# Patient Record
Sex: Male | Born: 1960 | Race: Black or African American | Hispanic: No | Marital: Single | Smoking: Current every day smoker
Health system: Southern US, Community
[De-identification: ages and names within clinical notes are randomized; demographics above are authoritative.]

## PROBLEM LIST (undated history)

## (undated) DIAGNOSIS — K219 Gastro-esophageal reflux disease without esophagitis: Secondary | ICD-10-CM

## (undated) DIAGNOSIS — M1990 Unspecified osteoarthritis, unspecified site: Secondary | ICD-10-CM

---

## 2004-11-04 ENCOUNTER — Encounter: Admission: RE | Admit: 2004-11-04 | Discharge: 2004-11-04 | Payer: Self-pay | Admitting: Occupational Medicine

## 2006-07-15 ENCOUNTER — Encounter: Admission: RE | Admit: 2006-07-15 | Discharge: 2006-07-15 | Payer: Self-pay | Admitting: Orthopaedic Surgery

## 2008-01-09 ENCOUNTER — Inpatient Hospital Stay (HOSPITAL_COMMUNITY): Admission: RE | Admit: 2008-01-09 | Discharge: 2008-01-12 | Payer: Self-pay | Admitting: Orthopaedic Surgery

## 2010-08-25 NOTE — Op Note (Signed)
NAMEKYLLE, LALL              ACCOUNT NO.:  [id number]   MEDICAL RECORD NO.:  [id number]          PATIENT TYPE:  INP   LOCATION:  5016                         FACILITY:  [facility]   PHYSICIAN:  Vanita Panda. 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   PROCEDURE:  Right total hip arthroplasty.   COMPONENTS:  DePuy ASR acetabular component size 52 cup, size 4 Summit  HA-coated femoral component high offset, size 46 femoral head, and -1  taper sleeve adapter.   SURGEON:  Vanita Panda. Magnus Ivan, MD   ASSISTANT:  Wende Neighbors, PA-C   ANESTHESIA:  General.   ANTIBIOTICS:  One gram IV Ancef.   BLOOD LOSS:  250 mL.   COMPLICATIONS:  None.   INDICATIONS:  Briefly, Mr. Koranda is a 50 year old male that I have  been following for several years with severe avascular necrosis  involving both his hips.  He had developed severe pain in his right hip  and it had worsened over a period of time.  I recommended he undergo  total hip replacement when it got to the point where the pain is  affecting his activities of daily living.  It is at this time now that  he is presenting with severe right hip pain and wishes to proceed, with  anything that has an impact on his activities daily living.  He wishes  to proceed with total hip replacement.  The risks and benefits of this  were explained him at length, and he agreed to proceed with surgery.   PROCEDURE DESCRIPTION:  After informed consent WAS obtained, appropriate  right hip was marked.  Mr. Claw was brought to the operating and  placed supine on the operating table.  General anesthesia was obtained.  He was then positioned in the lateral decubitus position with the right  operative hip up.   Hip positioners were then placed in the front and  back and an axillary roll was placed as well and appropriate padding was  placed to the down nonoperative left leg.  His right leg was then  prepped and draped with DuraPrep and sterile drapes including a sterile  stockinette.  A leg bag was placed as well for bring the leg off the  table since I used an anterior lateral approach.  A time-out was called  to identify the correct patient and correct right hip.  I then made an  incision directly over the greater trochanter and carried this  proximally and distally.  I dissected down to the iliotibial band and  divided this longitudinally.  From there, I took an anterolateral  approach to the hip.  I identified the gluteus medius and minimus  tendons and took these as sleeve off the vastus ridge.  I reflected  these anterior.  I then divided the capsule, and there was an effusion  noted in the hip.  There was abundant osteophytes and you could see  there was deformity of the femoral head.  The leg was then brought up  from the table and the hip was dislocated.  I made a femoral neck cut at  approximately a fingerbreadth or just a little more the fingerbreadth  from the tip of the lesser trochanter.  The femoral head was removed in  its entirety and size was collected off this.  I then cleaned the  acetabulum of debris and put Hohmann retractors in place.  I then  sequentially reamed the acetabulum, first with medializing and then  selecting my version, starting from 45-mm reamer up to 51.  As I got to  49, 50, and 51, I selected my version of the component.  I then placed a  trial 52 liner and this did bottom out and had good rim fit.  Next, I  placed the real size 52 acetabular component which was a metal-on-metal  component.  I tapped this into place and was felt to be stable.  Next, I  brought the leg off the table and to expose the femoral canal.  A canal  finding guide was inserted, and  a cookie cutter guide to gain access the  femoral canal.  I then used a lateralizing reamer and then reamed with a  size 0 reamer followed by the 2, 3 and then the size 3 and 4 reamers.  I  then started broaching from a size 1 broach up to size 4 broach.  The  size 4 broach was felt to be the most stable.  I cleaned the calcar off  this and then placed a high offset -1 neck with high offset and a size  46 trial head and reduced this into the acetabulum.  I put the hip  through range of motion and felt to be stable and his leg lengths were  felt to be equal.  I then removed the trial femoral component head and  neck.  I thoroughly irrigated tissues and then placed the real femoral  stem which was a Summit HA-coated stem with high offset.  Once I tapped  this into the femoral canal, I had -1 taper sleeve adapter and this  placed into the size 46 head.  I placed this and then reduced the hip  back to the acetabulum, put it through range of motion and was felt to  be stable.  Again, I felt his leg lengths were near equal.  I then  irrigated the tissues again and closed the joint capsule with  interrupted #1 Ethibond suture.  I reapproximated the gluteus medius and  minimus back to the vastus ridge a #1 Ethibond suture and oversewed this  with Ethibond suture as well.  I then closed the IT band in an  interrupted format with #1 Vicryl suture followed by 2-0 Vicryl in the  subcutaneous tissue and a running 4-0 Vicryl in subcuticular stitch.  A  well-padded sterile dressing was applied.  The patient was rolled into a  supine position.  His leg wounds were found to be near equal.  He was  awakened, extubated, and taken to recovery room in stable condition.  All final counts were correct, and there were no complications noted.      Vanita Panda. Magnus Ivan, M.D.  Electronically Signed     CYB/MEDQ  D:  01/09/2008  T:  01/10/2008  Job:  [id number]

## 2010-08-28 NOTE — Discharge Summary (Signed)
Bradley Mayo, Bradley Mayo              ACCOUNT NO.:  [id number]   MEDICAL RECORD NO.:  [id number]          PATIENT TYPE:  INP   LOCATION:  5016                         FACILITY:  [facility]   PHYSICIAN:  Vanita Panda. Magnus Ivan, M.D.DATE OF BIRTH:  [birth year]   DATE OF ADMISSION:  01/09/2008  DATE OF DISCHARGE:  01/12/2008                               DISCHARGE SUMMARY   ADMITTING DIAGNOSIS:  Stage IV avascular necrosis, right hip.   DISCHARGE DIAGNOSIS:  Stage IV avascular necrosis, right hip.   PROCEDURES:  Right total hip arthroplasty on January 09, 2008.   HOSPITAL COURSE:  Briefly, [name] is a 50 year old male who has  known bilateral hip avascular necrosis with the right being worse than  the left.  We have tried to follow this for some period of time and  temporize it with pain medications.  He has gotten to the point where  this is severely affecting his activities of daily living.  X-rays  confirm collapse on both the femoral head as well as the acetabular side  of the right hip.  His left hip does appear to be stable.  We  recommended a total hip arthroplasty given this impact on his daily life  and the nature of his disease.  He agreed to proceed with surgery.   On the day of admission, he was taken to the operating room where he  underwent a right total hip replacement without complications.  For  detailed description of the operation, please refer to the dictated  operative note in the patient's medical record.  Postoperatively, he was  admitted to orthopedic floor and had a normal convalescence without any  complications on the orthopedic floor.  By the day of discharge, he was  tolerating oral diet as well as oral pain medications.  It was felt that  he could be discharged home safely with home health followup.   DISPOSITION:  To home.   DISCHARGE MEDICATIONS:  1. Percocet.  2. Robaxin.  3. Coumadin.   DISCHARGE INSTRUCTIONS:  While at home, he can shower and  get his  incision wet.  He will have therapy come into his house to work with  balance and coordination as well as activities of daily living.  A  follow-up appointment will be established in my office in 2 weeks after  discharge.      Vanita Panda. Magnus Ivan, M.D.  Electronically Signed     CYB/MEDQ  D:  02/13/2008  T:  02/14/2008  Job:  [id number]

## 2011-01-11 LAB — CBC
HCT: 38.5 — ABNORMAL LOW
Hemoglobin: 10.8 — ABNORMAL LOW
Hemoglobin: 13
MCHC: 33.8
MCV: 86.6
RBC: 4.45
WBC: 4.6

## 2011-01-11 LAB — BASIC METABOLIC PANEL
GFR calc Af Amer: >60
Potassium: 4
Sodium: 134 — ABNORMAL LOW

## 2011-01-11 LAB — PROTIME-INR
INR: 0.9
INR: 1
Prothrombin Time: 13.1

## 2011-01-12 LAB — CBC
HCT: 27.4 — ABNORMAL LOW
Hemoglobin: 9.4 — ABNORMAL LOW
MCHC: 34.1
Platelets: 163
WBC: 7.8
WBC: 8.7

## 2011-01-12 LAB — BASIC METABOLIC PANEL
BUN: 6
CO2: 28
Calcium: 8.3 — ABNORMAL LOW
Chloride: 99
Creatinine, Ser: 1.01
GFR calc Af Amer: >60
GFR calc non Af Amer: >60
GFR calc non Af Amer: >60
Sodium: 137

## 2011-01-12 LAB — PROTIME-INR: INR: 1.5

## 2011-12-08 ENCOUNTER — Other Ambulatory Visit (HOSPITAL_COMMUNITY): Payer: Self-pay | Admitting: Orthopaedic Surgery

## 2012-01-26 ENCOUNTER — Encounter (HOSPITAL_COMMUNITY): Payer: Self-pay | Admitting: Pharmacy Technician

## 2012-01-28 ENCOUNTER — Encounter (HOSPITAL_COMMUNITY)
Admission: RE | Admit: 2012-01-28 | Discharge: 2012-01-28 | Disposition: A | Discharge status: Home / Self Care | Payer: BC Managed Care – PPO | Source: Ambulatory Visit | Attending: Orthopaedic Surgery | Admitting: Orthopaedic Surgery

## 2012-01-28 ENCOUNTER — Encounter (HOSPITAL_COMMUNITY): Payer: Self-pay

## 2012-01-28 DIAGNOSIS — M1990 Unspecified osteoarthritis, unspecified site: Secondary | ICD-10-CM

## 2012-01-28 DIAGNOSIS — K219 Gastro-esophageal reflux disease without esophagitis: Secondary | ICD-10-CM

## 2012-01-28 HISTORY — DX: Unspecified osteoarthritis, unspecified site: M19.90

## 2012-01-28 HISTORY — DX: Gastro-esophageal reflux disease without esophagitis: K21.9

## 2012-01-28 HISTORY — PX: JOINT REPLACEMENT: SHX530

## 2012-01-28 HISTORY — PX: HERNIA REPAIR: SHX51

## 2012-01-28 LAB — SURGICAL PCR SCREEN
MRSA, PCR: NEGATIVE
Staphylococcus aureus: POSITIVE — AB

## 2012-01-28 LAB — CBC
MCHC: 33 g/dL (ref 30.0–36.0)
Platelets: 265 10*3/uL (ref 150–400)
RDW: 14.9 % (ref 11.5–15.5)
WBC: 4.9 10*3/uL (ref 4.0–10.5)

## 2012-01-28 LAB — URINALYSIS, ROUTINE W REFLEX MICROSCOPIC
Nitrite: NEGATIVE
Protein, ur: NEGATIVE mg/dL
Specific Gravity, Urine: 1.019 (ref 1.005–1.030)
Urobilinogen, UA: 0.2 mg/dL (ref 0.0–1.0)

## 2012-01-28 LAB — ABO/RH: ABO/RH(D): O POS

## 2012-01-28 LAB — BASIC METABOLIC PANEL
BUN: 12 mg/dL (ref 6–23)
Calcium: 9.6 mg/dL (ref 8.4–10.5)
Chloride: 101 mEq/L (ref 96–112)
Creatinine, Ser: 0.96 mg/dL (ref 0.50–1.35)
GFR calc Af Amer: >90 mL/min (ref >90)
GFR calc non Af Amer: >90 mL/min (ref >90)

## 2012-01-28 NOTE — Pre-Procedure Instructions (Signed)
01-28-12 1700 pt. Notified of Positive PCR screen for Staph aureus-will fill Rx. For Mupirocin and uses as directed.W. Kennon Portela

## 2012-01-28 NOTE — Patient Instructions (Addendum)
[address]  01/28/2012   Your procedure is scheduled on:   02-04-2012  Report to [facility] at      0530  AM.  Call this number if you have problems the morning of surgery: [phone]  Or Presurgical Testing [phone](Bradley Mayo)   Remember:   Do not eat food:After Midnight.    Take these medicines the morning of surgery with A SIP OF WATER: Tramadol.   Do not wear jewelry, make-up or nail polish.  Do not wear lotions, powders, or perfumes. You may wear deodorant.  Do not shave 48 hours prior to surgery.(face and neck okay, no shaving of legs)  Do not bring valuables to the hospital.  Contacts, dentures or bridgework may not be worn into surgery.  Leave suitcase in the car. After surgery it may be brought to your room.  For patients admitted to the hospital, checkout time is 11:00 AM the day of discharge.   Patients discharged the day of surgery will not be allowed to drive home. Must have responsible person with you x 24 hours once discharged.  Name and phone number of your driver: Bradley Mayo, mother [phone]  Special Instructions: CHG Shower Use Special Wash: see special instruction sheet.(avoid face and genitals)   Please read over the following fact sheets that you were given: MRSA Information, Blood Transfusion fact sheet, Incentive Spirometry Instruction.

## 2012-02-04 ENCOUNTER — Encounter (HOSPITAL_COMMUNITY): Payer: Self-pay | Admitting: Anesthesiology

## 2012-02-04 ENCOUNTER — Ambulatory Visit (HOSPITAL_COMMUNITY): Payer: BC Managed Care – PPO | Admitting: Anesthesiology

## 2012-02-04 ENCOUNTER — Ambulatory Visit (HOSPITAL_COMMUNITY): Payer: BC Managed Care – PPO

## 2012-02-04 ENCOUNTER — Encounter (HOSPITAL_COMMUNITY): Payer: Self-pay | Admitting: *Deleted

## 2012-02-04 ENCOUNTER — Inpatient Hospital Stay (HOSPITAL_COMMUNITY)
Admission: RE | Admit: 2012-02-04 | Discharge: 2012-02-07 | DRG: 818 | Disposition: A | Discharge status: Home Health | Payer: BC Managed Care – PPO | Source: Ambulatory Visit | Attending: Orthopaedic Surgery | Admitting: Orthopaedic Surgery

## 2012-02-04 ENCOUNTER — Encounter (HOSPITAL_COMMUNITY)
Admission: RE | Disposition: A | Discharge status: Home Health | Payer: Self-pay | Source: Ambulatory Visit | Attending: Orthopaedic Surgery

## 2012-02-04 DIAGNOSIS — K219 Gastro-esophageal reflux disease without esophagitis: Secondary | ICD-10-CM | POA: Diagnosis present

## 2012-02-04 DIAGNOSIS — F17200 Nicotine dependence, unspecified, uncomplicated: Secondary | ICD-10-CM | POA: Diagnosis present

## 2012-02-04 DIAGNOSIS — M87059 Idiopathic aseptic necrosis of unspecified femur: Principal | ICD-10-CM

## 2012-02-04 DIAGNOSIS — Z01812 Encounter for preprocedural laboratory examination: Secondary | ICD-10-CM

## 2012-02-04 DIAGNOSIS — M8970 Major osseous defect, unspecified site: Secondary | ICD-10-CM | POA: Diagnosis present

## 2012-02-04 HISTORY — PX: TOTAL HIP ARTHROPLASTY: SHX124

## 2012-02-04 LAB — TYPE AND SCREEN: Antibody Screen: NEGATIVE

## 2012-02-04 SURGERY — ARTHROPLASTY, HIP, TOTAL, ANTERIOR APPROACH
Anesthesia: General | Site: Hip | Laterality: Left | Wound class: Clean

## 2012-02-04 MED ORDER — HYDROMORPHONE HCL PF 1 MG/ML IJ SOLN
0.5000 mg | INTRAMUSCULAR | Status: DC | PRN
  Administered 2012-02-04 (×2): 0.5 mg via INTRAVENOUS

## 2012-02-04 MED ORDER — ACETAMINOPHEN 10 MG/ML IV SOLN
INTRAVENOUS | Status: AC
  Filled 2012-02-04: qty 100

## 2012-02-04 MED ORDER — MIDAZOLAM HCL 5 MG/5ML IJ SOLN
INTRAMUSCULAR | Status: DC | PRN
  Administered 2012-02-04: 2 mg via INTRAVENOUS

## 2012-02-04 MED ORDER — SODIUM CHLORIDE 0.9 % IV SOLN
INTRAVENOUS | Status: DC
  Administered 2012-02-04 – 2012-02-05 (×2): via INTRAVENOUS

## 2012-02-04 MED ORDER — MENTHOL 3 MG MT LOZG: 1.0000 | LOZENGE | OROMUCOSAL | Status: DC | PRN

## 2012-02-04 MED ORDER — METOCLOPRAMIDE HCL 10 MG PO TABS: 5.0000 mg | ORAL_TABLET | Freq: Three times a day (TID) | ORAL | Status: DC | PRN

## 2012-02-04 MED ORDER — NEOSTIGMINE METHYLSULFATE 1 MG/ML IJ SOLN
INTRAMUSCULAR | Status: DC | PRN
  Administered 2012-02-04: 4 mg via INTRAVENOUS

## 2012-02-04 MED ORDER — GLYCOPYRROLATE 0.2 MG/ML IJ SOLN
INTRAMUSCULAR | Status: DC | PRN
  Administered 2012-02-04: 0.6 mg via INTRAVENOUS

## 2012-02-04 MED ORDER — PROMETHAZINE HCL 25 MG/ML IJ SOLN: 6.2500 mg | INTRAMUSCULAR | Status: DC | PRN

## 2012-02-04 MED ORDER — PROPOFOL 10 MG/ML IV BOLUS
INTRAVENOUS | Status: DC | PRN
  Administered 2012-02-04: 170 mg via INTRAVENOUS
  Administered 2012-02-04: 30 mg via INTRAVENOUS

## 2012-02-04 MED ORDER — KETOROLAC TROMETHAMINE 15 MG/ML IJ SOLN
INTRAMUSCULAR | Status: AC
  Administered 2012-02-04: 15 mg
  Filled 2012-02-04: qty 1

## 2012-02-04 MED ORDER — ALUM & MAG HYDROXIDE-SIMETH 200-200-20 MG/5ML PO SUSP: 30.0000 mL | ORAL | Status: DC | PRN

## 2012-02-04 MED ORDER — ZOLPIDEM TARTRATE 5 MG PO TABS: 5.0000 mg | ORAL_TABLET | Freq: Every evening | ORAL | Status: DC | PRN

## 2012-02-04 MED ORDER — ONDANSETRON HCL 4 MG PO TABS: 4.0000 mg | ORAL_TABLET | Freq: Four times a day (QID) | ORAL | Status: DC | PRN

## 2012-02-04 MED ORDER — HYDROMORPHONE HCL PF 1 MG/ML IJ SOLN
INTRAMUSCULAR | Status: AC
  Filled 2012-02-04: qty 1

## 2012-02-04 MED ORDER — LIDOCAINE HCL 1 % IJ SOLN
INTRAMUSCULAR | Status: DC | PRN
  Administered 2012-02-04: 60 mg via INTRADERMAL

## 2012-02-04 MED ORDER — DOCUSATE SODIUM 100 MG PO CAPS
100.0000 mg | ORAL_CAPSULE | Freq: Two times a day (BID) | ORAL | Status: DC
  Administered 2012-02-04 – 2012-02-07 (×6): 100 mg via ORAL

## 2012-02-04 MED ORDER — ONDANSETRON HCL 4 MG/2ML IJ SOLN: 4.0000 mg | Freq: Four times a day (QID) | INTRAMUSCULAR | Status: DC | PRN

## 2012-02-04 MED ORDER — ACETAMINOPHEN 650 MG RE SUPP: 650.0000 mg | Freq: Four times a day (QID) | RECTAL | Status: DC | PRN

## 2012-02-04 MED ORDER — CEFAZOLIN SODIUM 1-5 GM-% IV SOLN
1.0000 g | Freq: Four times a day (QID) | INTRAVENOUS | Status: AC
  Administered 2012-02-04 (×2): 1 g via INTRAVENOUS
  Filled 2012-02-04 (×2): qty 50

## 2012-02-04 MED ORDER — KETOROLAC TROMETHAMINE 15 MG/ML IJ SOLN
15.0000 mg | Freq: Four times a day (QID) | INTRAMUSCULAR | Status: AC
  Administered 2012-02-04 – 2012-02-05 (×4): 15 mg via INTRAVENOUS
  Filled 2012-02-04 (×3): qty 1

## 2012-02-04 MED ORDER — ACETAMINOPHEN 325 MG PO TABS: 650.0000 mg | ORAL_TABLET | Freq: Four times a day (QID) | ORAL | Status: DC | PRN

## 2012-02-04 MED ORDER — OXYCODONE HCL 5 MG PO TABS
5.0000 mg | ORAL_TABLET | ORAL | Status: DC | PRN
  Administered 2012-02-04 – 2012-02-06 (×10): 10 mg via ORAL
  Administered 2012-02-06: 5 mg via ORAL
  Administered 2012-02-06 – 2012-02-07 (×4): 10 mg via ORAL
  Filled 2012-02-04 (×6): qty 2
  Filled 2012-02-04: qty 1
  Filled 2012-02-04 (×8): qty 2

## 2012-02-04 MED ORDER — HYDROMORPHONE HCL PF 1 MG/ML IJ SOLN
0.2500 mg | INTRAMUSCULAR | Status: DC | PRN
  Administered 2012-02-04 (×4): 0.5 mg via INTRAVENOUS

## 2012-02-04 MED ORDER — CEFAZOLIN SODIUM-DEXTROSE 2-3 GM-% IV SOLR
INTRAVENOUS | Status: AC
  Filled 2012-02-04: qty 50

## 2012-02-04 MED ORDER — PHENOL 1.4 % MT LIQD: 1.0000 | OROMUCOSAL | Status: DC | PRN

## 2012-02-04 MED ORDER — LACTATED RINGERS IV SOLN
INTRAVENOUS | Status: DC | PRN
  Administered 2012-02-04 (×3): via INTRAVENOUS

## 2012-02-04 MED ORDER — RIVAROXABAN 10 MG PO TABS
10.0000 mg | ORAL_TABLET | Freq: Every day | ORAL | Status: DC
  Administered 2012-02-05 – 2012-02-07 (×3): 10 mg via ORAL
  Filled 2012-02-04 (×4): qty 1

## 2012-02-04 MED ORDER — OXYCODONE HCL ER 10 MG PO T12A
10.0000 mg | EXTENDED_RELEASE_TABLET | Freq: Two times a day (BID) | ORAL | Status: DC
  Administered 2012-02-04 – 2012-02-06 (×5): 10 mg via ORAL
  Filled 2012-02-04 (×6): qty 1

## 2012-02-04 MED ORDER — DIPHENHYDRAMINE HCL 12.5 MG/5ML PO ELIX: 12.5000 mg | ORAL_SOLUTION | ORAL | Status: DC | PRN

## 2012-02-04 MED ORDER — METHOCARBAMOL 500 MG PO TABS
500.0000 mg | ORAL_TABLET | Freq: Four times a day (QID) | ORAL | Status: DC | PRN
  Administered 2012-02-04 – 2012-02-07 (×6): 500 mg via ORAL
  Filled 2012-02-04 (×7): qty 1

## 2012-02-04 MED ORDER — METOCLOPRAMIDE HCL 5 MG/ML IJ SOLN: 5.0000 mg | Freq: Three times a day (TID) | INTRAMUSCULAR | Status: DC | PRN

## 2012-02-04 MED ORDER — 0.9 % SODIUM CHLORIDE (POUR BTL) OPTIME
TOPICAL | Status: DC | PRN
  Administered 2012-02-04: 1000 mL

## 2012-02-04 MED ORDER — FENTANYL CITRATE 0.05 MG/ML IJ SOLN
INTRAMUSCULAR | Status: DC | PRN
  Administered 2012-02-04 (×5): 50 ug via INTRAVENOUS

## 2012-02-04 MED ORDER — ONDANSETRON HCL 4 MG/2ML IJ SOLN
INTRAMUSCULAR | Status: DC | PRN
  Administered 2012-02-04: 4 mg via INTRAVENOUS

## 2012-02-04 MED ORDER — CEFAZOLIN SODIUM-DEXTROSE 2-3 GM-% IV SOLR
2.0000 g | INTRAVENOUS | Status: AC
  Administered 2012-02-04: 2 g via INTRAVENOUS

## 2012-02-04 MED ORDER — HYDROMORPHONE HCL PF 1 MG/ML IJ SOLN: 1.0000 mg | INTRAMUSCULAR | Status: DC | PRN

## 2012-02-04 MED ORDER — METHOCARBAMOL 100 MG/ML IJ SOLN
500.0000 mg | Freq: Four times a day (QID) | INTRAMUSCULAR | Status: DC | PRN
  Administered 2012-02-04: 500 mg via INTRAVENOUS
  Filled 2012-02-04: qty 5

## 2012-02-04 MED ORDER — ACETAMINOPHEN 10 MG/ML IV SOLN
INTRAVENOUS | Status: DC | PRN
  Administered 2012-02-04: 1000 mg via INTRAVENOUS

## 2012-02-04 MED ORDER — ROCURONIUM BROMIDE 100 MG/10ML IV SOLN
INTRAVENOUS | Status: DC | PRN
  Administered 2012-02-04: 50 mg via INTRAVENOUS
  Administered 2012-02-04: 20 mg via INTRAVENOUS

## 2012-02-04 SURGICAL SUPPLY — 37 items
BAG SPEC THK2 15X12 ZIP CLS (MISCELLANEOUS) ×2
BAG ZIPLOCK 12X15 (MISCELLANEOUS) ×4 IMPLANT
BLADE SAW SGTL 18X1.27X75 (BLADE) ×2 IMPLANT
CLOTH BEACON ORANGE TIMEOUT ST (SAFETY) ×2 IMPLANT
DRAPE C-ARM 42X72 X-RAY (DRAPES) ×2 IMPLANT
DRAPE STERI IOBAN 125X83 (DRAPES) ×2 IMPLANT
DRAPE U-SHAPE 47X51 STRL (DRAPES) ×6 IMPLANT
DRSG MEPILEX BORDER 4X8 (GAUZE/BANDAGES/DRESSINGS) ×2 IMPLANT
DURAPREP 26ML APPLICATOR (WOUND CARE) ×2 IMPLANT
ELECT BLADE TIP CTD 4 INCH (ELECTRODE) ×2 IMPLANT
ELECT REM PT RETURN 9FT ADLT (ELECTROSURGICAL) ×2
ELECTRODE REM PT RTRN 9FT ADLT (ELECTROSURGICAL) ×1 IMPLANT
FACESHIELD LNG OPTICON STERILE (SAFETY) ×8 IMPLANT
GAUZE XEROFORM 1X8 LF (GAUZE/BANDAGES/DRESSINGS) ×2 IMPLANT
GLOVE BIO SURGEON STRL SZ7 (GLOVE) ×1 IMPLANT
GLOVE BIO SURGEON STRL SZ7.5 (GLOVE) ×2 IMPLANT
GLOVE BIOGEL PI IND STRL 7.5 (GLOVE) IMPLANT
GLOVE BIOGEL PI IND STRL 8 (GLOVE) ×1 IMPLANT
GLOVE BIOGEL PI INDICATOR 7.5 (GLOVE)
GLOVE BIOGEL PI INDICATOR 8 (GLOVE) ×1
GLOVE ECLIPSE 7.0 STRL STRAW (GLOVE) ×1 IMPLANT
GLOVE SURG SS PI 6.5 STRL IVOR (GLOVE) ×2 IMPLANT
GLOVE SURG SS PI 7.5 STRL IVOR (GLOVE) ×5 IMPLANT
GOWN BRE IMP SLV SIRUS LXLNG (GOWN DISPOSABLE) ×1 IMPLANT
GOWN STRL REIN XL XLG (GOWN DISPOSABLE) ×5 IMPLANT
KIT BASIN OR (CUSTOM PROCEDURE TRAY) ×2 IMPLANT
PACK TOTAL JOINT (CUSTOM PROCEDURE TRAY) ×2 IMPLANT
PADDING CAST COTTON 6X4 STRL (CAST SUPPLIES) ×2 IMPLANT
STAPLER VISISTAT 35W (STAPLE) ×1 IMPLANT
SUT ETHIBOND NAB CT1 #1 30IN (SUTURE) ×2 IMPLANT
SUT VIC AB 1 CT1 36 (SUTURE) ×4 IMPLANT
SUT VIC AB 2-0 CT1 27 (SUTURE) ×4
SUT VIC AB 2-0 CT1 TAPERPNT 27 (SUTURE) ×2 IMPLANT
SUT VLOC 180 0 24IN GS25 (SUTURE) ×1 IMPLANT
TOWEL OR 17X26 10 PK STRL BLUE (TOWEL DISPOSABLE) ×4 IMPLANT
TOWEL OR NON WOVEN STRL DISP B (DISPOSABLE) ×2 IMPLANT
TRAY FOLEY CATH 14FRSI W/METER (CATHETERS) ×2 IMPLANT

## 2012-02-04 NOTE — Anesthesia Postprocedure Evaluation (Signed)
  Anesthesia Post-op Note  Patient: Bradley Mayo  Procedure(s) Performed: Procedure(s) (LRB): TOTAL HIP ARTHROPLASTY ANTERIOR APPROACH (Left)  Patient Location: PACU  Anesthesia Type: General  Level of Consciousness: awake and alert   Airway and Oxygen Therapy: Patient Spontanous Breathing  Post-op Pain: mild  Post-op Assessment: Post-op Vital signs reviewed, Patient's Cardiovascular Status Stable, Respiratory Function Stable, Patent Airway and No signs of Nausea or vomiting  Post-op Vital Signs: stable  Complications: No apparent anesthesia complications

## 2012-02-04 NOTE — Op Note (Signed)
[name], Bradley Mayo NO.:  [id number]  MEDICAL RECORD NO.:  [id number]  LOCATION:  1621                         FACILITY:  [facility]  PHYSICIAN:  Vanita Panda. Magnus Ivan, M.D.DATE OF BIRTH:  11-08-1960  DATE OF PROCEDURE:  02/04/2012 DATE OF DISCHARGE:                              OPERATIVE REPORT   PREOPERATIVE DIAGNOSIS:  Avascular necrosis, left hip.  POSTOP DIAGNOSIS:  Avascular necrosis, left hip.  PROCEDURE:  Left total hip arthroplasty through direct anterior approach.  IMPLANTS:  DePuy Sector Gription acetabular component size 52, size 36+ 4 neutral polyethylene liner, size 10 Corail femoral component from DePuy with standard offset, size 36+ 1.5 ceramic hip ball.  SURGEON:  Vanita Panda. Magnus Ivan, MD  ASSISTANT:  [facility] staff.  ANESTHESIA:  General.  BLOOD LOSS:  Less than 200 mL.  COMPLICATIONS:  None.  INDICATIONS:  Bradley Mayo is a 51 year old gentleman with bilateral hip avascular necrosis.  In 2009 he underwent right hip replacement.  He has severe pain now in his left hip.  It affects his activities of daily living.  X-rays confirmed collapse of the femoral head and disease in the femoral head.  He now wishes to proceed with total hip arthroplasty on the left side.  The risks and benefits of this have been explained to him in detail and he does wish to proceed with surgery.  PROCEDURE DESCRIPTION:  After informed consent was obtained, appropriate left hip was marked.  He was brought to the operating room, placed supine on the operating table and general anesthesia was obtained while he was on the stretcher.  Foley catheter was placed and both feet were placed in traction boots and an in-line skeletal traction.  He was placed on the Hana fracture table supine with the perineal post in place and both feet in skeletal traction but no traction applied.  His left hip was then assessed under direct fluoroscopy as well as the center of the  pelvis and the right hip.  We then prepped the left hip with DuraPrep and sterile drapes.  A time-out was called to identify the correct patient, correct left hip.  I then made incision just inferior and posterior to the anterior-superior iliac spine and carried this obliquely down the leg.  I dissected down to the tensor fascia lata and the tensor fascia was divided longitudinally so that I could proceed with a direct anterior approach to the hip.  A Cobra retractor was placed around the lateral neck and then up underneath the rectus femoris, a medial retractor was placed.  I cauterized the lateral femoral circumflex vessels and then I was able to identify the hip capsule.  I then divided the hip capsule and put the Cobra retractors within the hip capsule.  I made my femoral neck cut then just proximal to the lesser trochanter and then completed this with an osteotome.  I removed the femoral head in its entirety with cord screw guide and found loss of the cartilage and collapse of the femoral head.  I then placed a bent Hohmann medially and a Cobra retractor laterally.  I cleaned the acetabulum debris as well as remnants of the labrum and then began reaming  from size 42 reamer all the way up to a size 52 with all reamers placed under direct visualization.  The last reamer placed under direct fluoroscopy so that I could obtain my depth of reaming, my inclination and version.  I then placed the real sip size 52 acetabular component which is a diffuse Electronics engineer.  I placed a real 36+ 4 neutral polyethylene liner.  Attention was then turned to the femur. With all traction off the leg, the leg was externally rotated to 90 degrees, extended and adducted.  This allowed access to the femoral canal.  I placed a Mueller retractor medially and a Hohmann retractor behind the greater trochanter.  I released the lateral capsule and piriformis and brought the leg up higher.  I used a box  cutting guide to open up the femoral canal as well as a rongeur.  I then began broaching from a size 8 broach, only up to a size 10.  I then trialed a standard neck and a 36+ 1.5 hip ball with bringing the leg back over and up. With full traction and internal rotation, we reduced the hip and put it to internal and external rotation.  It was stable.  There was minimal shuck and leg lengths were measured equal under direct fluoroscopy.  I then re-dislocated the hip and took out the trial components.  I placed the real Corail femoral component with HA coating and the real ceramic 36+ 1.5 hip ball and reduced this in the acetabulum again.  I was pleased with the leg length and the stability.  We irrigated the soft tissues and closed the joint capsule with interrupted #1 Ethibond suture followed by running 0 V-Loc in the tensor fascia lata, 2-0 Vicryl in subcutaneous tissue, and staples on the skin.  Well-padded sterile dressing was applied and he was taken off the Hana table and extubated and taken to recovery room in stable condition.  All final counts were correct.  There were no complications noted.     Vanita Panda. Magnus Ivan, M.D.     CYB/MEDQ  D:  02/04/2012  T:  02/04/2012  Job:  [id number]

## 2012-02-04 NOTE — Progress Notes (Signed)
Physical Therapy Note  Pt declined mobility today, only wanted to sit EOB at most, so will defer to nsg staff to dangle this evening and attempt evaluation POD day 1.  Zenovia Jarred, PT Pager: [phone]

## 2012-02-04 NOTE — Brief Op Note (Signed)
02/04/2012  9:07 AM  PATIENT:  Bradley Mayo  51 y.o. male  PRE-OPERATIVE DIAGNOSIS:  Left hip avascular necrosis  POST-OPERATIVE DIAGNOSIS:  Left hip avascular necrosis  PROCEDURE:  Procedure(s) (LRB) with comments: TOTAL HIP ARTHROPLASTY ANTERIOR APPROACH (Left) - Left Total Hip Arthroplasty, Anterior Approach (C-Arm)  SURGEON:  Surgeon(s) and Role:    * Kathryne Hitch, MD - Primary  PHYSICIAN ASSISTANT:   ASSISTANTS: none   ANESTHESIA:   general  EBL:  Total I/O In: 2000 [I.V.:2000] Out: 295 [Urine:170; Blood:125]  BLOOD ADMINISTERED:none  DRAINS: none   LOCAL MEDICATIONS USED:  NONE  SPECIMEN:  No Specimen  DISPOSITION OF SPECIMEN:  N/A  COUNTS:  YES  TOURNIQUET:  * No tourniquets in log *  DICTATION: .Other Dictation: Dictation Number [id number]  PLAN OF CARE: Admit to inpatient   PATIENT DISPOSITION:  PACU - hemodynamically stable.   Delay start of Pharmacological VTE agent (>24hrs) due to surgical blood loss or risk of bleeding: no

## 2012-02-04 NOTE — Anesthesia Preprocedure Evaluation (Addendum)
Anesthesia Evaluation  Patient identified by MRN, date of birth, ID band Patient awake    Reviewed: Allergy & Precautions, H&P , NPO status , Patient's Chart, lab work & pertinent test results  Airway Mallampati: II TM Distance: >3 FB Neck ROM: Full    Dental No notable dental hx.    Pulmonary Current Smoker,  breath sounds clear to auscultation  Pulmonary exam normal       Cardiovascular negative cardio ROS  Rhythm:Regular Rate:Normal     Neuro/Psych negative neurological ROS  negative psych ROS   GI/Hepatic Neg liver ROS, GERD-  Medicated,  Endo/Other  negative endocrine ROS  Renal/GU negative Renal ROS  negative genitourinary   Musculoskeletal negative musculoskeletal ROS (+)   Abdominal   Peds negative pediatric ROS (+)  Hematology negative hematology ROS (+)   Anesthesia Other Findings   Reproductive/Obstetrics negative OB ROS                           Anesthesia Physical Anesthesia Plan  ASA: II  Anesthesia Plan: General   Post-op Pain Management:    Induction: Intravenous  Airway Management Planned: Oral ETT  Additional Equipment:   Intra-op Plan:   Post-operative Plan: Extubation in OR  Informed Consent: I have reviewed the patients History and Physical, chart, labs and discussed the procedure including the risks, benefits and alternatives for the proposed anesthesia with the patient or authorized representative who has indicated his/her understanding and acceptance.   Dental advisory given  Plan Discussed with: CRNA  Anesthesia Plan Comments: (Discussed r/b general versus spinal. Patient prefers general.)       Anesthesia Quick Evaluation

## 2012-02-04 NOTE — Transfer of Care (Signed)
Immediate Anesthesia Transfer of Care Note  Patient: Bradley Mayo  Procedure(s) Performed: Procedure(s) (LRB) with comments: TOTAL HIP ARTHROPLASTY ANTERIOR APPROACH (Left) - Left Total Hip Arthroplasty, Anterior Approach (C-Arm)  Patient Location: PACU  Anesthesia Type: General  Level of Consciousness: awake, sedated and patient cooperative  Airway & Oxygen Therapy: Patient Spontanous Breathing and Patient connected to face mask oxygen  Post-op Assessment: Report given to PACU RN and Post -op Vital signs reviewed and stable  Post vital signs: Reviewed and stable  Complications: No apparent anesthesia complications

## 2012-02-04 NOTE — Progress Notes (Signed)
Utilization review completed.  

## 2012-02-04 NOTE — H&P (Signed)
TOTAL HIP ADMISSION H&P  Patient is admitted for left total hip arthroplasty.  Subjective:  Chief Complaint: left hip pain  HPI: Bradley Mayo, 51 y.o. male, has a history of pain and functional disability in the left hip(s) due to avascular necrosis and patient has failed non-surgical conservative treatments for greater than 12 weeks to include NSAID's and/or analgesics, use of assistive devices and activity modification.  Onset of symptoms was gradual starting 5 years ago with gradually worsening course since that time.The patient noted no past surgery on the left hip(s).  Patient currently rates pain in the left hip at 8 out of 10 with activity. Patient has night pain, worsening of pain with activity and weight bearing, pain that interfers with activities of daily living and pain with passive range of motion. Patient has evidence of subchondral cysts and femoral head collapse by imaging studies. This condition presents safety issues increasing the risk of falls. This patient has had avascular necrosis of the hip, acetabular fracture, hip dysplasia.  There is no current active infection.  Patient Active Problem List   Diagnosis Date Noted  . Avascular necrosis of hip 02/04/2012   Past Medical History  Diagnosis Date  . Arthritis 01-28-12    osteoarthritis,avascular necrosis left hip  . GERD (gastroesophageal reflux disease) 01-28-12    uses TUMS also for this    Past Surgical History  Procedure Date  . Joint replacement 01-28-12    RTHA -2 yrs ago  . Hernia repair 01-28-12    Bilateral inguinal hernia    Prescriptions prior to admission  Medication Sig Dispense Refill  . ibuprofen (ADVIL,MOTRIN) 800 MG tablet Take 800 mg by mouth every 8 (eight) hours as needed. For pain      . calcium carbonate (TUMS - DOSED IN MG ELEMENTAL CALCIUM) 500 MG chewable tablet Chew 1 tablet by mouth daily as needed. For indigestion      . traMADol (ULTRAM) 50 MG tablet Take 50 mg by mouth every 6 (six)  hours as needed. For pain       No Known Allergies  History  Substance Use Topics  . Smoking status: Current Every Day Smoker -- 0.5 packs/day for 30 years    Types: Cigarettes  . Smokeless tobacco: Not on file  . Alcohol Use: 0.6 oz/week    1 Cans of beer per week     1 beer per day    History reviewed. No pertinent family history.   Review of Systems  Musculoskeletal: Positive for joint pain.  All other systems reviewed and are negative.    Objective:  Physical Exam  Constitutional: He is oriented to person, place, and time. He appears well-developed and well-nourished.  HENT:  Head: Normocephalic and atraumatic.  Eyes: EOM are normal. Pupils are equal, round, and reactive to light.  Neck: Normal range of motion. Neck supple.  Cardiovascular: Normal rate and regular rhythm.   Respiratory: Effort normal and breath sounds normal.  GI: Soft. Bowel sounds are normal.  Musculoskeletal:       Left hip: He exhibits decreased range of motion, bony tenderness and crepitus.  Neurological: He is alert and oriented to person, place, and time.  Skin: Skin is warm and dry.  Psychiatric: He has a normal mood and affect.    Vital signs in last 24 hours: Temp:  [97.7 F (36.5 C)] 97.7 F (36.5 C) (10/25 0541) Pulse Rate:  [90] 90  (10/25 0541) Resp:  [20] 20  (10/25 0541) BP: (150)/(98)  150/98 mmHg (10/25 0541) SpO2:  [100 %] 100 % (10/25 0541)  Labs:   There is no height or weight on file to calculate BMI.   Imaging Review Plain radiographs demonstrate severe degenerative joint disease of the left hip(s). The bone quality appears to be good for age and reported activity level.  Assessment/Plan:  End stage arthritis, left hip(s)  The patient history, physical examination, clinical judgement of the provider and imaging studies are consistent with end stage degenerative joint disease of the left hip(s) and total hip arthroplasty is deemed medically necessary. The treatment  options including medical management, injection therapy, arthroscopy and arthroplasty were discussed at length. The risks and benefits of total hip arthroplasty were presented and reviewed. The risks due to aseptic loosening, infection, stiffness, dislocation/subluxation,  thromboembolic complications and other imponderables were discussed.  The patient acknowledged the explanation, agreed to proceed with the plan and consent was signed. Patient is being admitted for inpatient treatment for surgery, pain control, PT, OT, prophylactic antibiotics, VTE prophylaxis, progressive ambulation and ADL's and discharge planning.The patient is planning to be discharged home with home health services

## 2012-02-05 LAB — BASIC METABOLIC PANEL
Calcium: 7.7 mg/dL — ABNORMAL LOW (ref 8.4–10.5)
Creatinine, Ser: 1.08 mg/dL (ref 0.50–1.35)
GFR calc non Af Amer: 78 mL/min — ABNORMAL LOW (ref >90)
Glucose, Bld: 112 mg/dL — ABNORMAL HIGH (ref 70–99)
Sodium: 135 mEq/L (ref 135–145)

## 2012-02-05 LAB — CBC
MCH: 28.4 pg (ref 26.0–34.0)
MCHC: 32.7 g/dL (ref 30.0–36.0)
Platelets: 175 10*3/uL (ref 150–400)

## 2012-02-05 MED ORDER — KETOROLAC TROMETHAMINE 15 MG/ML IJ SOLN
15.0000 mg | Freq: Once | INTRAMUSCULAR | Status: AC
  Administered 2012-02-05: 15 mg via INTRAVENOUS
  Filled 2012-02-05: qty 1

## 2012-02-05 NOTE — Evaluation (Signed)
Occupational Therapy Evaluation Patient Details Name: Bradley Mayo MRN: [id number] DOB: January 12, 1961 Today's Date: 02/05/2012 Time: 7829-5621 OT Time Calculation (min): 29 min  OT Assessment / Plan / Recommendation Clinical Impression  Pleasant 51 yr old male admitted for elective left THA.  Pt overall close supervision for mobility to and from the bathroom with his RW for toileting tasks.  Exhibits slight difficulty reaching the left foot for selfcare tasks but has AE and remembers proper use.  Will have initial supervision at discharge.  No further OT needs.     OT Assessment  Patient does not need any further OT services    Follow Up Recommendations  No OT follow up       Equipment Recommendations  None recommended by OT          Precautions / Restrictions Precautions Precautions: None Restrictions Weight Bearing Restrictions: No Other Position/Activity Restrictions: WBAT   Pertinent Vitals/Pain Pt with no report of pain when asked    ADL  Eating/Feeding: Simulated;Independent Where Assessed - Eating/Feeding: Chair Grooming: Simulated;Supervision/safety Where Assessed - Grooming: Unsupported sitting Upper Body Bathing: Simulated;Set up Where Assessed - Upper Body Bathing: Unsupported sitting Lower Body Bathing: Simulated;Minimal assistance Where Assessed - Lower Body Bathing: Unsupported sit to stand Upper Body Dressing: Simulated;Set up Where Assessed - Upper Body Dressing: Unsupported sitting Lower Body Dressing: Simulated;Minimal assistance Where Assessed - Lower Body Dressing: Unsupported sit to stand Toilet Transfer: Simulated;Supervision/safety Toilet Transfer Method: Other (comment) (ambulate with RW.) Toilet Transfer Equipment: Comfort height toilet;Grab bars Toileting - Clothing Manipulation and Hygiene: Simulated;Min guard Where Assessed - Engineer, mining and Hygiene: Sit to stand from 3-in-1 or toilet Tub/Shower Transfer Method: Not  assessed Equipment Used: Rolling walker Transfers/Ambulation Related to ADLs: Pt overall close supervision for mobility using the RW in his room. ADL Comments: Pt familiar with AE from previous THR about 2 years ago.  He was able to return demonstrate appropriate use of reacher and sockaide for donning and doffing socks.  Already has 3:1 at home and therapist recommended initial use. Discussed possible use of shower tub bench and pt plans to sponge bathe at discharge initially until he can step over the edge of the tub.         Visit Information  Last OT Received On: 02/05/12 Assistance Needed: +1    Subjective Data  Subjective: "I want to get back to work." Patient Stated Goal: To get back to work.   Prior Functioning     Home Living Lives With: Family Available Help at Discharge: Available 24 hours/day Type of Home: House Home Access: Ramped entrance Home Layout: One level Bathroom Shower/Tub: Tub/shower unit;Curtain Firefighter: Standard Bathroom Accessibility: Yes How Accessible: Accessible via walker Home Adaptive Equipment: Walker - rolling;Bedside commode/3-in-1 Prior Function Level of Independence: Independent Able to Take Stairs?: Yes Driving: Yes Vocation: Full time employment Communication Communication: No difficulties Dominant Hand: Right         Vision/Perception Vision - Assessment Eye Alignment: Within Functional Limits Vision Assessment: Vision not tested Praxis Praxis: Intact   Cognition  Overall Cognitive Status: Appears within functional limits for tasks assessed/performed Arousal/Alertness: Awake/alert Orientation Level: Appears intact for tasks assessed Behavior During Session: [facility] for tasks performed    Extremity/Trunk Assessment Right Upper Extremity Assessment RUE ROM/Strength/Tone: Within functional levels RUE Sensation: WFL - Light Touch RUE Coordination: WFL - gross/fine motor Left Upper Extremity Assessment LUE  ROM/Strength/Tone: Within functional levels LUE Sensation: WFL - Light Touch LUE Coordination: WFL - gross/fine motor Right  Lower Extremity Assessment RLE ROM/Strength/Tone: [facility] for tasks assessed Left Lower Extremity Assessment LLE ROM/Strength/Tone: Deficits LLE ROM/Strength/Tone Deficits: Hip strength 2+/5with AAROM at hip to 95 flex and 20 abd Trunk Assessment Trunk Assessment: Normal     Mobility Bed Mobility Bed Mobility: Supine to Sit Supine to Sit: 3: Mod assist Details for Bed Mobility Assistance: cues for sequence and use of R LE and UEs to self assist Transfers Transfers: Sit to Stand Sit to Stand: 5: Supervision;With upper extremity assist;From chair/3-in-1 Stand to Sit: 5: Supervision;Without upper extremity assist;To chair/3-in-1 Details for Transfer Assistance: cues for LE management and use of UEs to self assist        Exercise Total Joint Exercises Ankle Circles/Pumps: AROM;15 reps;Supine;Both Quad Sets: AROM;10 reps;Both;Supine Heel Slides: AAROM;10 reps;Supine;Left Hip ABduction/ADduction: AAROM;10 reps;Left;Supine   Balance Balance Balance Assessed: Yes Dynamic Standing Balance Dynamic Standing - Balance Support: Right upper extremity supported;Left upper extremity supported Dynamic Standing - Level of Assistance: 5: Stand by assistance   End of Session OT - End of Session Activity Tolerance: Patient tolerated treatment well Patient left: in bed;with call bell/phone within reach;with family/visitor present Nurse Communication: Mobility status     Dae Highley OTR/L Pager number [phone] 02/05/2012, 2:35 PM

## 2012-02-05 NOTE — Progress Notes (Signed)
Physical Therapy Treatment Patient Details Name: Bradley Mayo MRN: [id number] DOB: 17-Jun-1960 Today's Date: 02/05/2012 Time: 9562-1308 PT Time Calculation (min): 15 min  PT Assessment / Plan / Recommendation Comments on Treatment Session  Pt with difficulty progressing to recip gait - appears to be vaulting over L LE - ? leg length descrepancy    Follow Up Recommendations  Home health PT     Does the patient have the potential to tolerate intense rehabilitation     Barriers to Discharge        Equipment Recommendations  None recommended by OT    Recommendations for Other Services OT consult  Frequency 7X/week   Plan Discharge plan remains appropriate    Precautions / Restrictions Precautions Precautions: None Restrictions Weight Bearing Restrictions: No Other Position/Activity Restrictions: WBAT   Pertinent Vitals/Pain 6-7/10; Pain MEds requested; ice pack provided    Mobility  Transfers Transfers: Sit to Stand;Stand to Sit Sit to Stand: 4: Min assist Stand to Sit: 4: Min assist Details for Transfer Assistance: cues for LE management and use of UEs to self assist Ambulation/Gait Ambulation/Gait Assistance: 3: Mod assist;4: Min assist Ambulation Distance (Feet): 123 Feet Assistive device: Rolling walker Ambulation/Gait Assistance Details: cues for posture, sequence and position from RW.Marland Kitchen Pt attempting recip gait but appears to be vaulting over L LE. Gait Pattern: Step-to pattern;Step-through pattern    Exercises     PT Diagnosis:    PT Problem List:   PT Treatment Interventions:     PT Goals Acute Rehab PT Goals PT Goal Formulation: With patient Time For Goal Achievement: 02/09/12 Potential to Achieve Goals: Good Pt will go Supine/Side to Sit: with supervision PT Goal: Supine/Side to Sit - Progress: Goal set today Pt will go Sit to Supine/Side: with supervision PT Goal: Sit to Supine/Side - Progress: Goal set today Pt will go Sit to Stand: with  supervision PT Goal: Sit to Stand - Progress: Progressing toward goal Pt will go Stand to Sit: with supervision PT Goal: Stand to Sit - Progress: Progressing toward goal Pt will Ambulate: with supervision;>150 feet;with rolling walker PT Goal: Ambulate - Progress: Progressing toward goal  Visit Information  Last PT Received On: 02/05/12 Assistance Needed: +1    Subjective Data  Subjective: I think this leg is longer than the other one Patient Stated Goal: Resume previous lifestyle with decreased pain   Cognition  Overall Cognitive Status: Appears within functional limits for tasks assessed/performed Arousal/Alertness: Awake/alert Orientation Level: Appears intact for tasks assessed Behavior During Session: [facility] for tasks performed    Balance  Balance Balance Assessed: Yes Dynamic Standing Balance Dynamic Standing - Balance Support: Right upper extremity supported;Left upper extremity supported Dynamic Standing - Level of Assistance: 5: Stand by assistance  End of Session     GP     Bradley Mayo 02/05/2012, 3:59 PM

## 2012-02-05 NOTE — Evaluation (Signed)
Physical Therapy Evaluation Patient Details Name: Bradley Mayo MRN: [id number] DOB: June 26, 1960 Today's Date: 02/05/2012 Time: 4098-1191 PT Time Calculation (min): 35 min  PT Assessment / Plan / Recommendation Clinical Impression  Pt s/p L THR presents with decreased L LE strength/ROM limiting functional mobility    PT Assessment  Patient needs continued PT services    Follow Up Recommendations  Home health PT    Does the patient have the potential to tolerate intense rehabilitation      Barriers to Discharge None      Equipment Recommendations  None recommended by PT    Recommendations for Other Services OT consult   Frequency 7X/week    Precautions / Restrictions Precautions Precautions: None Restrictions Weight Bearing Restrictions: No Other Position/Activity Restrictions: WBAT   Pertinent Vitals/Pain 5/10 premedicated,       Mobility  Bed Mobility Bed Mobility: Supine to Sit Supine to Sit: 3: Mod assist Details for Bed Mobility Assistance: cues for sequence and use of R LE and UEs to self assist Transfers Transfers: Sit to Stand;Stand to Sit Sit to Stand: 3: Mod assist Stand to Sit: 3: Mod assist Details for Transfer Assistance: cues for LE management and use of UEs to self assist Ambulation/Gait Ambulation/Gait Assistance: 3: Mod assist Ambulation Distance (Feet): 52 Feet Assistive device: Rolling walker Ambulation/Gait Assistance Details: cues for sequence, posture, position from RW and stride length Gait Pattern: Step-to pattern    Shoulder Instructions     Exercises Total Joint Exercises Ankle Circles/Pumps: AROM;15 reps;Supine;Both Quad Sets: AROM;10 reps;Both;Supine Heel Slides: AAROM;10 reps;Supine;Left Hip ABduction/ADduction: AAROM;10 reps;Left;Supine   PT Diagnosis: Difficulty walking  PT Problem List: Decreased strength;Decreased range of motion;Decreased activity tolerance;Decreased mobility;Decreased safety awareness;Pain;Decreased  knowledge of use of DME PT Treatment Interventions:     PT Goals Acute Rehab PT Goals PT Goal Formulation: With patient Time For Goal Achievement: 02/09/12 Potential to Achieve Goals: Good Pt will go Supine/Side to Sit: with supervision PT Goal: Supine/Side to Sit - Progress: Goal set today Pt will go Sit to Supine/Side: with supervision PT Goal: Sit to Supine/Side - Progress: Goal set today Pt will go Sit to Stand: with supervision PT Goal: Sit to Stand - Progress: Goal set today Pt will go Stand to Sit: with supervision PT Goal: Stand to Sit - Progress: Goal set today Pt will Ambulate: with supervision;>150 feet;with rolling walker PT Goal: Ambulate - Progress: Goal set today  Visit Information  Last PT Received On: 02/05/12 Assistance Needed: +1    Subjective Data  Subjective: I had the other hip 2 yrs ago but they did it the other way Patient Stated Goal: Resume previous lifestyle with decreased pain   Prior Functioning  Home Living Lives With: Family Available Help at Discharge: Available 24 hours/day Type of Home: House Home Access: Ramped entrance Home Layout: One level Bathroom Shower/Tub: Tub/shower unit;Curtain Firefighter: Standard Bathroom Accessibility: Yes How Accessible: Accessible via walker Home Adaptive Equipment: Walker - rolling;Bedside commode/3-in-1 Prior Function Level of Independence: Independent Able to Take Stairs?: Yes Driving: Yes Vocation: Full time employment Communication Communication: No difficulties Dominant Hand: Right    Cognition  Overall Cognitive Status: Appears within functional limits for tasks assessed/performed Arousal/Alertness: Awake/alert Orientation Level: Appears intact for tasks assessed Behavior During Session: [facility] for tasks performed    Extremity/Trunk Assessment Right Upper Extremity Assessment RUE ROM/Strength/Tone: [facility] for tasks assessed Left Upper Extremity Assessment LUE ROM/Strength/Tone: [facility] for  tasks assessed Right Lower Extremity Assessment RLE ROM/Strength/Tone: [facility] for tasks assessed Left  Lower Extremity Assessment LLE ROM/Strength/Tone: Deficits LLE ROM/Strength/Tone Deficits: Hip strength 2+/5with AAROM at hip to 95 flex and 20 abd   Balance    End of Session PT - End of Session Activity Tolerance: Patient tolerated treatment well Patient left: in chair;with call bell/phone within reach Nurse Communication: Mobility status  GP     Bradley Mayo 02/05/2012, 2:01 PM

## 2012-02-05 NOTE — Care Management Note (Signed)
    Page 1 of 2   02/07/2012     3:41:54 PM   CARE MANAGEMENT NOTE 02/07/2012  Patient:  Bradley Mayo, Bradley Mayo   Account Number:  [id number]  Date Initiated:  02/05/2012  Documentation initiated by:  Lanier Clam  Subjective/Objective Assessment:   ADMITTED W/AVN OF HIP     Action/Plan:   FROM HOME.USED [facility] IN PAST.HAS 3N1,RW.   Anticipated DC Date:  02/07/2012   Anticipated DC Plan:  HOME W HOME HEALTH SERVICES  In-house referral  NA      DC Planning Services  CM consult      [facility] Choice  HOME HEALTH   Choice offered to / List presented to:  C-1 Patient   DME arranged  NA      DME agency  NA     HH arranged  HH-2 PT  HH-3 OT      [facility] agency  [facility]   Status of service:  Completed, signed off Medicare Important Message given?  NO (If response is "NO", the following Medicare IM given date fields will be blank) Date Medicare IM given:   Date Additional Medicare IM given:    Discharge Disposition:  HOME W HOME HEALTH SERVICES  Per UR Regulation:    If discussed at Long Length of Stay Meetings, dates discussed:    Comments:  02/07/2012 Damaris Schooner RN CCM [phone] PT WILL D/C TODAY WITH [facility] PLACE. START DATE TOMORROW FOR HHPT. NO DME NEEDS. [facility] THAT PT IS DISCHARGING.  10/27 16:22p debbie dowell rn,bsn received call from [facility]. they cannot take pt's in [place] for phy ther. spoke w mr barnette and he's ok using a differnet agency. he's familiar w [facility] and will use them, faxed ref to [facility] for pt/ot.  02/05/12 KATHY MAHABIR RN,BSN NCM [phone] FAXED TO [facility] FAX#[phone],H&P,FACE SHEET W/CONFIRMATION.

## 2012-02-05 NOTE — Progress Notes (Signed)
SCIP - Anticoagulation   Xarelto due at 0730, AET was 0922 Spoke with Okey Regal, RN at 0700 this AM and made her aware of tight window to achieve SCIP measure.  Okey Regal already aware and has passed off Xarelto dose to next shift nurse who plans to give dose as soon as breakfast tray arrives, which should be soon per RN.  Thanks,  Clance Boll, PharmD, BCPS Pager: [phone] 02/05/2012 7:04 AM

## 2012-02-06 LAB — CBC
MCH: 28.1 pg (ref 26.0–34.0)
Platelets: 168 10*3/uL (ref 150–400)
RBC: 3.49 MIL/uL — ABNORMAL LOW (ref 4.22–5.81)
RDW: 14.7 % (ref 11.5–15.5)
WBC: 7 10*3/uL (ref 4.0–10.5)

## 2012-02-06 MED ORDER — POLYETHYLENE GLYCOL 3350 17 G PO PACK
17.0000 g | PACK | Freq: Two times a day (BID) | ORAL | Status: DC
  Administered 2012-02-06 – 2012-02-07 (×3): 17 g via ORAL

## 2012-02-06 MED ORDER — OXYCODONE HCL ER 10 MG PO T12A
10.0000 mg | EXTENDED_RELEASE_TABLET | Freq: Two times a day (BID) | ORAL | Status: DC
  Administered 2012-02-06 – 2012-02-07 (×2): 10 mg via ORAL
  Filled 2012-02-06 (×2): qty 1

## 2012-02-06 NOTE — Progress Notes (Signed)
Physical Therapy Treatment Patient Details Name: Bradley Mayo MRN: [id number] DOB: 1960-09-30 Today's Date: 02/06/2012 Time: 7829-5621 PT Time Calculation (min): 31 min  PT Assessment / Plan / Recommendation Comments on Treatment Session  Pt appears to have leg length descrepancy and is vaulting over operative LE.  Placed low sneaker on R LE - pt appears more level and states feels much better    Follow Up Recommendations  Home health PT     Does the patient have the potential to tolerate intense rehabilitation     Barriers to Discharge        Equipment Recommendations  None recommended by OT    Recommendations for Other Services OT consult  Frequency 7X/week   Plan Discharge plan remains appropriate    Precautions / Restrictions Precautions Precautions: None Restrictions Weight Bearing Restrictions: No Other Position/Activity Restrictions: WBAT   Pertinent Vitals/Pain 6-7/10; premedicated, ice pack provided    Mobility  Bed Mobility Bed Mobility: Supine to Sit Supine to Sit: 4: Min assist Details for Bed Mobility Assistance: cues for sequence and use of R LE and UEs to self assist Transfers Transfers: Sit to Stand;Stand to Sit Sit to Stand: 4: Min assist;4: Min guard Stand to Sit: 4: Min assist;4: Min guard Details for Transfer Assistance: cues for LE management and use of UEs to self assist Ambulation/Gait Ambulation/Gait Assistance: 4: Min assist Ambulation Distance (Feet): 80 Feet (and 65) Assistive device: Rolling walker Ambulation/Gait Assistance Details: cues for posture and position from RW.  Placed low rise sneaker on R foot - pt states feels level Gait Pattern: Step-to pattern;Step-through pattern    Exercises Total Joint Exercises Ankle Circles/Pumps: AROM;Supine;Both;20 reps Quad Sets: AROM;Both;Supine;20 reps Gluteal Sets: AROM;10 reps;Supine;Both Heel Slides: AAROM;Supine;Left;20 reps Hip ABduction/ADduction: AAROM;Left;Supine;15 reps   PT  Diagnosis:    PT Problem List:   PT Treatment Interventions:     PT Goals Acute Rehab PT Goals PT Goal Formulation: With patient Time For Goal Achievement: 02/09/12 Potential to Achieve Goals: Good Pt will go Supine/Side to Sit: with supervision PT Goal: Supine/Side to Sit - Progress: Progressing toward goal Pt will go Sit to Supine/Side: with supervision PT Goal: Sit to Supine/Side - Progress: Progressing toward goal Pt will go Sit to Stand: with supervision PT Goal: Sit to Stand - Progress: Progressing toward goal Pt will go Stand to Sit: with supervision PT Goal: Stand to Sit - Progress: Progressing toward goal Pt will Ambulate: with supervision;>150 feet;with rolling walker PT Goal: Ambulate - Progress: Progressing toward goal  Visit Information  Last PT Received On: 02/06/12 Assistance Needed: +1    Subjective Data  Subjective: Still feels like this leg is long Patient Stated Goal: Resume previous lifestyle with decreased pain   Cognition  Overall Cognitive Status: Appears within functional limits for tasks assessed/performed Arousal/Alertness: Awake/alert Orientation Level: Appears intact for tasks assessed Behavior During Session: [facility] for tasks performed    Balance     End of Session     GP     Falana Clagg 02/06/2012, 12:37 PM

## 2012-02-06 NOTE — Progress Notes (Signed)
Subjective: 2 Days Post-Op Procedure(s) (LRB): TOTAL HIP ARTHROPLASTY ANTERIOR APPROACH (Left) Patient reports pain as mild.    Objective: Vital signs in last 24 hours: Temp:  [99.4 F (37.4 C)-99.5 F (37.5 C)] 99.4 F (37.4 C) (10/27 0640) Pulse Rate:  [99-101] 99  (10/27 0640) Resp:  [16-20] 16  (10/27 0640) BP: (122-132)/(76-79) 122/76 mmHg (10/27 0640) SpO2:  [95 %-97 %] 95 % (10/27 0640)  Intake/Output from previous day: 10/26 0701 - 10/27 0700 In: 1320 [P.O.:1080; I.V.:140] Out: 3355 [Urine:3355] Intake/Output this shift:     Basename 02/06/12 0442 02/05/12 0458  HGB 9.8* 9.8*    Basename 02/06/12 0442 02/05/12 0458  WBC 7.0 4.4  RBC 3.49* 3.45*  HCT 30.1* 30.0*  PLT 168 175    Basename 02/05/12 0458  NA 135  K 3.8  CL 101  CO2 26  BUN 6  CREATININE 1.08  GLUCOSE 112*  CALCIUM 7.7*   No results found for this basename: LABPT:2,INR:2 in the last 72 hours  Neurovascular intact Sensation intact distally Intact pulses distally Dorsiflexion/Plantar flexion intact Incision: dressing C/D/I  Assessment/Plan: 2 Days Post-Op Procedure(s) (LRB): TOTAL HIP ARTHROPLASTY ANTERIOR APPROACH (Left) Up with therapy Plan for discharge tomorrow  Kathryne Hitch 02/06/2012, 7:34 AM

## 2012-02-06 NOTE — Progress Notes (Signed)
Physical Therapy Treatment Patient Details Name: Bradley Mayo MRN: [id number] DOB: Jan 30, 1961 Today's Date: 02/06/2012 Time: 0865-7846 PT Time Calculation (min): 23 min  PT Assessment / Plan / Recommendation Comments on Treatment Session  Decreased pain with ambulation using sneaker on L    Follow Up Recommendations  Home health PT     Does the patient have the potential to tolerate intense rehabilitation     Barriers to Discharge        Equipment Recommendations  None recommended by OT    Recommendations for Other Services    Frequency 7X/week   Plan Discharge plan remains appropriate    Precautions / Restrictions Precautions Precautions: None Restrictions Weight Bearing Restrictions: No Other Position/Activity Restrictions: WBAT   Pertinent Vitals/Pain 6/10; premedicated, ice pack provided    Mobility  Bed Mobility Bed Mobility: Sit to Supine Sit to Supine: 4: Min assist Details for Bed Mobility Assistance: min assist with L LE - pt attempted to cross legs to assist with R LE but unable Transfers Transfers: Sit to Stand;Stand to Sit Sit to Stand: 5: Supervision;4: Min guard Stand to Sit: 5: Supervision;4: Min guard Details for Transfer Assistance: cues for LE management and use of UEs to self assist Ambulation/Gait Ambulation/Gait Assistance: 4: Min guard;5: Supervision Ambulation Distance (Feet): 220 Feet Assistive device: Rolling walker Ambulation/Gait Assistance Details: min cues for posture and to increase L knee flex for swing phase Gait Pattern: Step-to pattern;Step-through pattern    Exercises     PT Diagnosis:    PT Problem List:   PT Treatment Interventions:     PT Goals Acute Rehab PT Goals PT Goal Formulation: With patient Time For Goal Achievement: 02/09/12 Potential to Achieve Goals: Good Pt will go Supine/Side to Sit: with supervision PT Goal: Supine/Side to Sit - Progress: Progressing toward goal Pt will go Sit to Supine/Side: with  supervision PT Goal: Sit to Supine/Side - Progress: Progressing toward goal Pt will go Sit to Stand: with supervision PT Goal: Sit to Stand - Progress: Progressing toward goal Pt will go Stand to Sit: with supervision PT Goal: Stand to Sit - Progress: Progressing toward goal Pt will Ambulate: with supervision;>150 feet;with rolling walker PT Goal: Ambulate - Progress: Progressing toward goal  Visit Information  Last PT Received On: 02/06/12 Assistance Needed: +1    Subjective Data  Subjective: This feels so much better walking with one shoe on Patient Stated Goal: Resume previous lifestyle with decreased pain   Cognition  Overall Cognitive Status: Appears within functional limits for tasks assessed/performed Arousal/Alertness: Awake/alert Orientation Level: Appears intact for tasks assessed Behavior During Session: [facility] for tasks performed    Balance     End of Session PT - End of Session Activity Tolerance: Patient tolerated treatment well Patient left: in bed;with call bell/phone within reach Nurse Communication: Mobility status   GP     Carey Lafon 02/06/2012, 3:50 PM

## 2012-02-07 ENCOUNTER — Encounter (HOSPITAL_COMMUNITY): Payer: Self-pay | Admitting: Orthopaedic Surgery

## 2012-02-07 LAB — CBC
HCT: 27.7 % — ABNORMAL LOW (ref 39.0–52.0)
Hemoglobin: 9.2 g/dL — ABNORMAL LOW (ref 13.0–17.0)
MCH: 28.4 pg (ref 26.0–34.0)
MCHC: 33.2 g/dL (ref 30.0–36.0)
RBC: 3.24 MIL/uL — ABNORMAL LOW (ref 4.22–5.81)

## 2012-02-07 MED ORDER — OXYCODONE-ACETAMINOPHEN 5-325 MG PO TABS: 1.0000 | ORAL_TABLET | ORAL | Status: AC | PRN

## 2012-02-07 MED ORDER — ASPIRIN EC 325 MG PO TBEC: 325.0000 mg | DELAYED_RELEASE_TABLET | Freq: Two times a day (BID) | ORAL | Status: AC

## 2012-02-07 MED ORDER — METHOCARBAMOL 500 MG PO TABS: 500.0000 mg | ORAL_TABLET | Freq: Four times a day (QID) | ORAL | Status: AC | PRN

## 2012-02-07 NOTE — Progress Notes (Signed)
Physical Therapy Treatment Patient Details Name: Bradley Mayo MRN: [id number] DOB: 1960/10/07 Today's Date: 02/07/2012 Time: 8413-2440 PT Time Calculation (min): 24 min  PT Assessment / Plan / Recommendation Comments on Treatment Session  POD #3 L Direct Anterior Approach THR progressing very well.  Given handout on HEP.  Pt plans to D/C to home today. Pt plan to stay with his cousin.    Follow Up Recommendations  Home health PT     Does the patient have the potential to tolerate intense rehabilitation     Barriers to Discharge        Equipment Recommendations       Recommendations for Other Services    Frequency 7X/week   Plan Discharge plan remains appropriate    Precautions / Restrictions Precautions Precautions: None Precaution Comments: Direct Anterior Approach THR Restrictions Weight Bearing Restrictions: No Other Position/Activity Restrictions: WBAT    Pertinent Vitals/Pain C/o "soreness" ICE applied after TE's    Mobility  Bed Mobility Bed Mobility: Supine to Sit Supine to Sit: 5: Supervision Details for Bed Mobility Assistance: only reqires increased time  Transfers Transfers: Sit to Stand;Stand to Sit Sit to Stand: 6: Modified independent (Device/Increase time);From bed Stand to Sit: 6: Modified independent (Device/Increase time);To chair/3-in-1 Details for Transfer Assistance: good use of hands and safety cognition  Ambulation/Gait Ambulation/Gait Assistance: 5: Supervision Ambulation Distance (Feet): 225 Feet Assistive device: Rolling walker Ambulation/Gait Assistance Details: <25% VC's to inceased L knee flexion and equal WBing Gait Pattern: Step-through pattern Gait velocity: decreased General Gait Details: no stairs, pt has a ramp    Exercises Total Joint Exercises Ankle Circles/Pumps: AROM;Both;10 reps;Supine Quad Sets: AROM;Both;10 reps;Supine Gluteal Sets: AROM;Both;10 reps;Supine Short Arc Quad: AROM;Left;10 reps;Supine Heel Slides:  AAROM;Left;10 reps;Supine Hip ABduction/ADduction: AAROM;Left;10 reps;Supine   PT Goals progressing    Visit Information  Last PT Received On: 02/07/12                   End of Session PT - End of Session Equipment Utilized During Treatment: Gait belt Activity Tolerance: Patient tolerated treatment well Patient left: in chair;with call bell/phone within reach;Other (comment) (ICE to L hip)   Felecia Shelling  PTA [facility]  Acute  Rehab Pager     [phone]

## 2012-02-07 NOTE — Progress Notes (Signed)
Discharge instructions and prescriptions reviewed with patient no questions at this time. Patient discharge home with family.

## 2012-02-07 NOTE — Progress Notes (Signed)
Discharge summary sent to payer through [facility]  

## 2012-02-07 NOTE — Discharge Summary (Signed)
Patient ID: Bradley Mayo MRN: [id number] DOB/AGE: 1961-01-22 51 y.o.  Admit date: 02/04/2012 Discharge date: 02/07/2012  Admission Diagnoses:  Principal Problem:  *Avascular necrosis of hip   Discharge Diagnoses:  Same  Past Medical History  Diagnosis Date  . Arthritis 01-28-12    osteoarthritis,avascular necrosis left hip  . GERD (gastroesophageal reflux disease) 01-28-12    uses TUMS also for this    Surgeries: Procedure(s): TOTAL HIP ARTHROPLASTY ANTERIOR APPROACH on 02/04/2012   Consultants:    Discharged Condition: Improved  Hospital Course: Bradley Mayo is an 51 y.o. male who was admitted 02/04/2012 for operative treatment ofAvascular necrosis of hip. Patient has severe unremitting pain that affects sleep, daily activities, and work/hobbies. After pre-op clearance the patient was taken to the operating room on 02/04/2012 and underwent  Procedure(s): TOTAL HIP ARTHROPLASTY ANTERIOR APPROACH.    Patient was given perioperative antibiotics: Anti-infectives     Start     Dose/Rate Route Frequency Ordered Stop   02/04/12 1400   ceFAZolin (ANCEF) IVPB 1 g/50 mL premix        1 g 100 mL/hr over 30 Minutes Intravenous Every 6 hours 02/04/12 1121 02/04/12 2032   02/04/12 0557   ceFAZolin (ANCEF) IVPB 2 g/50 mL premix        2 g 100 mL/hr over 30 Minutes Intravenous 60 min pre-op 02/04/12 0557 02/04/12 0736           Patient was given sequential compression devices, early ambulation, and chemoprophylaxis to prevent DVT.  Patient benefited maximally from hospital stay and there were no complications.    Recent vital signs: Patient Vitals for the past 24 hrs:  BP Temp Temp src Pulse Resp SpO2  02/07/12 0629 119/79 mmHg 98.5 F (36.9 C) Oral 88  16  96 %  03-02-12 2130 134/79 mmHg 100.1 F (37.8 C) Oral 98  20  95 %  March 02, 2012 1532 122/78 mmHg 98.3 F (36.8 C) Oral 84  16  94 %     Recent laboratory studies:  Basename 02/07/12 0430 2012-03-02 0442 02/05/12  0458  WBC 6.2 7.0 --  HGB 9.2* 9.8* --  HCT 27.7* 30.1* --  PLT 188 168 --  NA -- -- 135  K -- -- 3.8  CL -- -- 101  CO2 -- -- 26  BUN -- -- 6  CREATININE -- -- 1.08  GLUCOSE -- -- 112*  INR -- -- --  CALCIUM -- -- 7.7*     Discharge Medications:     Medication List     As of 02/07/2012  7:20 AM    STOP taking these medications         ibuprofen 800 MG tablet   Commonly known as: ADVIL,MOTRIN      traMADol 50 MG tablet   Commonly known as: ULTRAM      TAKE these medications         aspirin EC 325 MG tablet   Take 1 tablet (325 mg total) by mouth 2 (two) times daily.      calcium carbonate 500 MG chewable tablet   Commonly known as: TUMS - dosed in mg elemental calcium   Chew 1 tablet by mouth daily as needed. For indigestion      methocarbamol 500 MG tablet   Commonly known as: ROBAXIN   Take 1 tablet (500 mg total) by mouth every 6 (six) hours as needed.      oxyCODONE-acetaminophen 5-325 MG per tablet   Commonly known as: PERCOCET/ROXICET  Take 1-2 tablets by mouth every 4 (four) hours as needed for pain.        Diagnostic Studies: Dg Hip Complete Left  02/04/2012  *RADIOLOGY REPORT*  Clinical Data: Left hip arthroplasty  LEFT HIP - COMPLETE 2+ VIEW  Comparison: None.  Findings: 2 C-arm images show bilateral total hip arthroplasties with two views of the left.  Components appear grossly well positioned.  No radiographically detectable complication.  IMPRESSION: Left hip arthroplasty without apparent complication.   Original Report Authenticated By: Thomasenia Sales, M.D.    Dg Pelvis Portable  02/04/2012  *RADIOLOGY REPORT*  Clinical Data: Post left total hip replacement  PORTABLE PELVIS  Comparison: Portable exam 0954 hours compared to earlier intraoperative images of the 02/04/2012  Findings: Bilateral hip prostheses identified. Acetabular and femoral components are in expected positions without fracture or dislocation. Skin clips, subcutaneous gas and  soft tissue edema at the left hip consistent with preceding surgery. Visualized pelvis appears intact.  IMPRESSION: Left hip prosthesis without acute complication. Old right hip prosthesis.   Original Report Authenticated By: Lollie Marrow, M.D.    Dg Hip Portable 1 View Left  02/04/2012  *RADIOLOGY REPORT*  Clinical Data: Status post hip replacement.  PORTABLE LEFT HIP - 1 VIEW  Comparison: Intraoperative views this same date.  Findings: Left total hip replacement is in place.  The device is located.  No fracture is identified.  IMPRESSION: Left total hip without evidence of complication.   Original Report Authenticated By: Bernadene Bell. D'ALESSIO, M.D.    Dg C-arm 1-60 Min-no Report  02/04/2012  CLINICAL DATA: left hip surgery   C-ARM 1-60 MINUTES  Fluoroscopy was utilized by the requesting physician.  No radiographic  interpretation.      Disposition:   To home      Discharge Orders    Future Orders Please Complete By Expires   Diet - low sodium heart healthy      Call MD / Call 911      Comments:   If you experience chest pain or shortness of breath, CALL 911 and be transported to the hospital emergency room.  If you develope a fever above 101 F, pus (white drainage) or increased drainage or redness at the wound, or calf pain, call your surgeon's office.   Constipation Prevention      Comments:   Drink plenty of fluids.  Prune juice may be helpful.  You may use a stool softener, such as Colace (over the counter) 100 mg twice a day.  Use MiraLax (over the counter) for constipation as needed.   Increase activity slowly as tolerated      Discharge instructions      Comments:   Increase your activities as comfort allows. Expect thigh, leg, and foot swelling. You can get your actual incision wet in the shower starting this Wed 10/30, then dry dressing daily   Home Health      Questions: Responses:   To provide the following care/treatments PT   Face-to-face encounter      Comments:   I  Gerene Nedd Y certify that this patient is under my care and that I, or a nurse practitioner or physician's assistant working with me, had a face-to-face encounter that meets the physician face-to-face encounter requirements with this patient on 02/07/2012.   Questions: Responses:   The encounter with the patient was in whole, or in part, for the following medical condition, which is the primary reason for home health care post  total hip replacement   I certify that, based on my findings, the following services are medically necessary home health services Physical therapy   My clinical findings support the need for the above services Post Hip Replacement with limited ambulation and strength   Further, I certify that my clinical findings support that this patient is homebound due to: Ambulates short distances less than 300 feet   To provide the following care/treatments PT   Discharge patient         Follow-up Information    Follow up with Kathryne Hitch, MD. In 2 weeks.   Contact information:   [address] [place] [address] [phone]           Signed: Kathryne Hitch 02/07/2012, 7:20 AM

## 2013-01-18 IMAGING — CR DG PORTABLE PELVIS
1 series · 1 of 1 positions shown · non-contrast
Comparison: Portable exam 9818 hours compared to earlier
intraoperative images of the 02/04/2012

CLINICAL DATA: Post left total hip replacement

PORTABLE PELVIS

[AP]
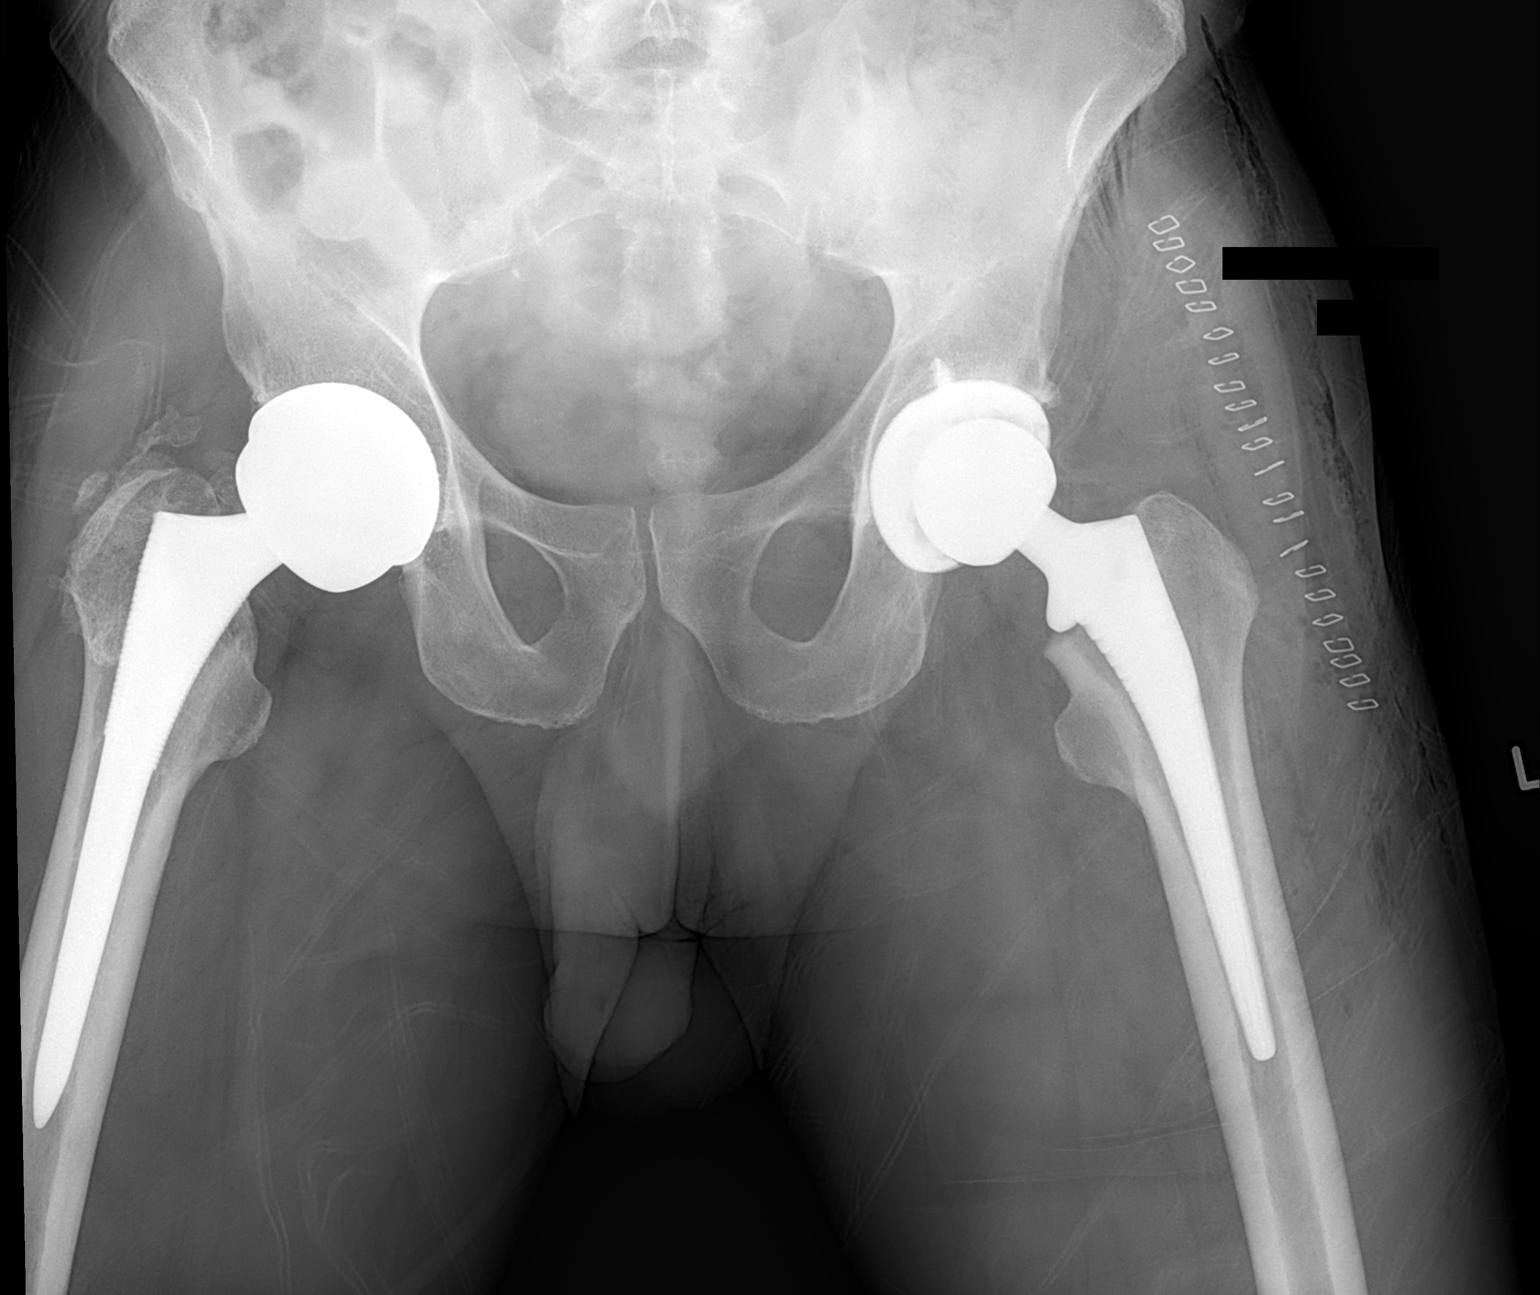

[1 of 1 positions shown; findings below may reference images not displayed]

FINDINGS: Bilateral hip prostheses identified.
Acetabular and femoral components are in expected positions without
fracture or dislocation.
Skin clips, subcutaneous gas and soft tissue edema at the left hip
consistent with preceding surgery.
Visualized pelvis appears intact.
IMPRESSION: Left hip prosthesis without acute complication.
Old right hip prosthesis.

## 2013-01-18 IMAGING — CR DG HIP 1V PORT*L*
1 series · 1 of 1 positions shown · non-contrast
Comparison: Intraoperative views this same date.

CLINICAL DATA: Status post hip replacement.

PORTABLE LEFT HIP - 1 VIEW

[AP]
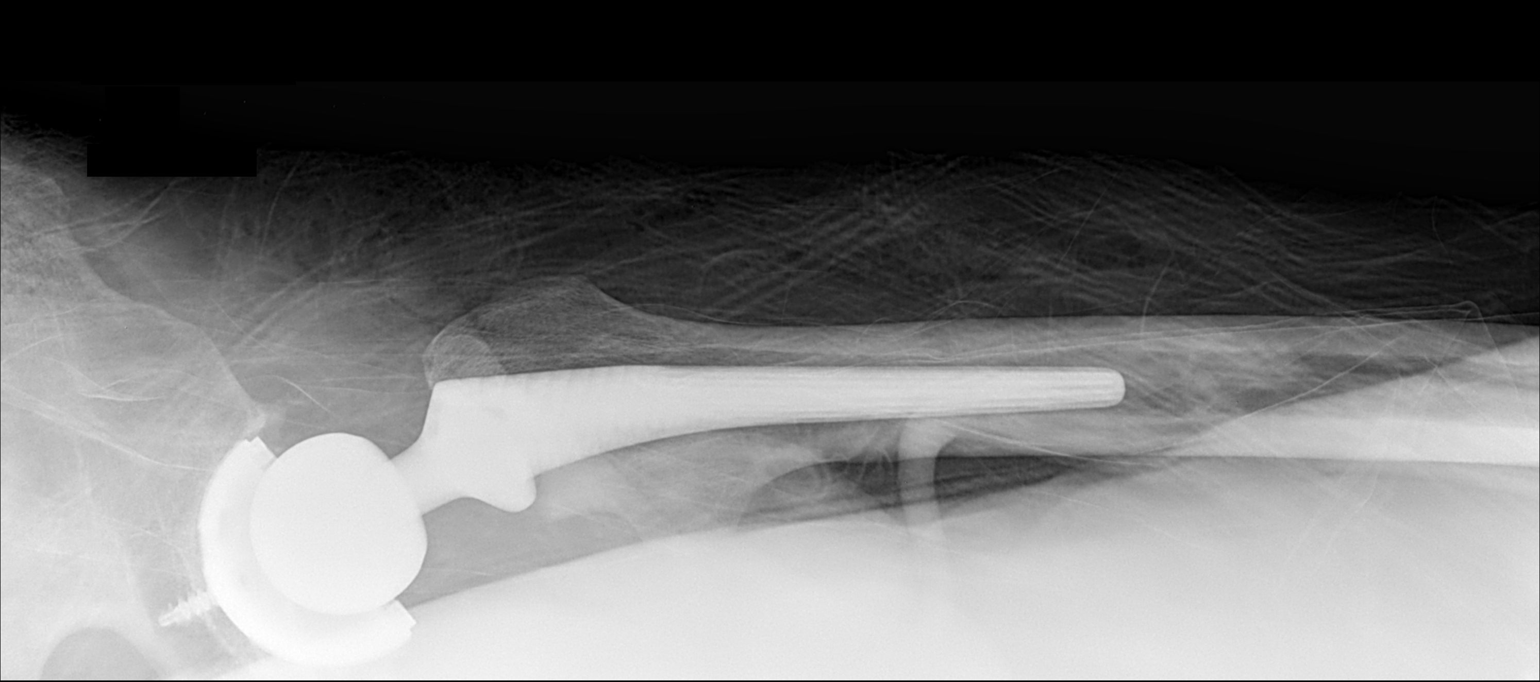

[1 of 1 positions shown; findings below may reference images not displayed]

FINDINGS: Left total hip replacement is in place.  The device is
located.  No fracture is identified.
IMPRESSION: Left total hip without evidence of complication.

## 2015-01-28 ENCOUNTER — Emergency Department (HOSPITAL_COMMUNITY)
Admission: EM | Admit: 2015-01-28 | Discharge: 2015-01-28 | Disposition: A | Discharge status: Home / Self Care | Payer: Self-pay | Attending: Emergency Medicine | Admitting: Emergency Medicine

## 2015-01-28 ENCOUNTER — Encounter (HOSPITAL_COMMUNITY): Payer: Self-pay | Admitting: General Practice

## 2015-01-28 DIAGNOSIS — I471 Supraventricular tachycardia: Secondary | ICD-10-CM | POA: Insufficient documentation

## 2015-01-28 DIAGNOSIS — M1990 Unspecified osteoarthritis, unspecified site: Secondary | ICD-10-CM | POA: Insufficient documentation

## 2015-01-28 DIAGNOSIS — Z720 Tobacco use: Secondary | ICD-10-CM | POA: Insufficient documentation

## 2015-01-28 DIAGNOSIS — K219 Gastro-esophageal reflux disease without esophagitis: Secondary | ICD-10-CM | POA: Insufficient documentation

## 2015-01-28 DIAGNOSIS — Z7982 Long term (current) use of aspirin: Secondary | ICD-10-CM | POA: Insufficient documentation

## 2015-01-28 NOTE — ED Notes (Signed)
Pt brought in via [facility]. Pt was at work when he had a sudden onset of diaphoresis, dizziness, and tachycardia. When EMS arrived pt was noted to be in SVT with HR up to 192. Pt was given 6mg  of Adenosine at 1611 with no conversion, and 12mg  at 1614 with conversion and HR in the low 100's. Pt is currently asymptomatic. Pt denies any CP, SOB, or N/V. Pt is A/O.

## 2015-01-28 NOTE — ED Provider Notes (Signed)
CSN: [id number]     Arrival date & time 01/28/15  1639 History   First MD Initiated Contact with Patient 01/28/15 1703     Chief Complaint  Patient presents with  . Tachycardia     (Consider location/radiation/quality/duration/timing/severity/associated sxs/prior Treatment) The history is provided by the patient.   54 year old male had sudden onset of feeling sweaty and then got his heart racing. There is no associated dyspnea or nausea and no associated chest pain or heaviness or tightness or pressure. Symptoms did not resolve so he called for EMS. EMS noted that he was in a supraventricular tachycardia with rate in the 190s. He was given 6 mg of adenosine without any relief and then given 12 mg of adenosine with conversion to sinus tachycardia with heart rate in the low 100s. He estimates that his heart had been racing for about 45 minutes before resolution. His never any similar episodes before. He feels like he is back to normal now. He does drink one cup of coffee a day and smokes about half pack of cigarettes a day. He does not use any stimulant medications.  Past Medical History  Diagnosis Date  . Arthritis 01-28-12    osteoarthritis,avascular necrosis left hip  . GERD (gastroesophageal reflux disease) 01-28-12    uses TUMS also for this   Past Surgical History  Procedure Laterality Date  . Joint replacement  01-28-12    RTHA -2 yrs ago  . Hernia repair  01-28-12    Bilateral inguinal hernia  . Total hip arthroplasty  02/04/2012    Procedure: TOTAL HIP ARTHROPLASTY ANTERIOR APPROACH;  Surgeon: Kathryne Hitchhristopher Y Blackman, MD;  Location: [facility];  Service: Orthopedics;  Laterality: Left;  Left Total Hip Arthroplasty, Anterior Approach (C-Arm)   No family history on file. Social History  Substance Use Topics  . Smoking status: Current Every Day Smoker -- 0.50 packs/day for 30 years    Types: Cigarettes  . Smokeless tobacco: None  . Alcohol Use: 4.2 oz/week    7 Cans of beer per  week     Comment: 1 beer per day    Review of Systems  All other systems reviewed and are negative.     Allergies  Review of patient's allergies indicates no known allergies.  Home Medications   Prior to Admission medications   Medication Sig Start Date End Date Taking? Authorizing Provider  aspirin EC 325 MG tablet Take 1 tablet (325 mg total) by mouth 2 (two) times daily. 02/07/12   Kathryne Hitchhristopher Y Blackman, MD  calcium carbonate (TUMS - DOSED IN MG ELEMENTAL CALCIUM) 500 MG chewable tablet Chew 1 tablet by mouth daily as needed. For indigestion    Historical Provider, MD  methocarbamol (ROBAXIN) 500 MG tablet Take 1 tablet (500 mg total) by mouth every 6 (six) hours as needed. 02/07/12   Kathryne Hitchhristopher Y Blackman, MD   BP 144/96 mmHg  Pulse 111  Temp(Src) 98.5 F (36.9 C) (Oral)  Resp 20  Ht 5\' 6"  (1.676 m)  Wt 150 lb (68.04 kg)  BMI 24.22 kg/m2  SpO2 100% Physical Exam  Nursing note and vitals reviewed.  54 year old male, resting comfortably and in no acute distress. Vital signs are significant for tachycardia, hypertension. Oxygen saturation is 100%, which is normal. Head is normocephalic and atraumatic. PERRLA, EOMI. Oropharynx is clear. Neck is nontender and supple without adenopathy or JVD. Back is nontender and there is no CVA tenderness. Lungs are clear without rales, wheezes, or rhonchi. Chest is  nontender. Heart has regular rate and rhythm without murmur. Abdomen is soft, flat, nontender without masses or hepatosplenomegaly and peristalsis is normoactive. Extremities have no cyanosis or edema, full range of motion is present. Skin is warm and dry without rash. Neurologic: Mental status is normal, cranial nerves are intact, there are no motor or sensory deficits.  ED Course  Procedures (including critical care time)    EKG Interpretation   Date/Time:  Tuesday January 28 2015 16:46:40 EDT Ventricular Rate:  109 PR Interval:  150 QRS Duration: 74 QT  Interval:  328 QTC Calculation: 442 R Axis:   45 Text Interpretation:  Sinus tachycardia Otherwise within normal limits No  old tracing to compare Confirmed by [facility]  MD, Jacelyn Cuen ([id number]) on 01/28/2015  4:49:35 PM      MDM   Final diagnoses:  PSVT (paroxysmal supraventricular tachycardia) (HCC)    Paroxysmal supraventricular tachycardia treated successfully by EMS. I reviewed the ECG strips from EMS and they clearly show supraventricular tachycardia. Old records are reviewed and he has no relevant past visits. No indication for any testing today but I've advised the patient that at his age, he should obtain a PCP for routine screening tests. He is given phone number for [facility].    Dione Booze, MD 01/28/15 [phone]

## 2015-01-28 NOTE — Discharge Instructions (Signed)
Paroxysmal Supraventricular Tachycardia Paroxysmal supraventricular tachycardia (PSVT) is a type of abnormal heart rhythm. It causes your heart to beat very quickly and then suddenly stop beating so quickly. A normal heart rate is 60-100 beats per minute. During an episode of PSVT, your heart rate may be 150-250 beats per minute. This can make you feel light-headed and short of breath. An episode of PSVT can be frightening. It is usually not dangerous. The heart has four chambers. All chambers need to work together for the heart to beat effectively. A normal heartbeat usually starts in the right upper chamber of the heart (atrium) when an area (sinoatrial node) puts out an electrical signal that spreads to the other chambers. People with PSVT may have abnormal electrical pathways, or they may have other areas in the upper chambers that send out electrical signals. The result is a very rapid heartbeat. When your heart beats very quickly, it does not have time to fill completely with blood. When PSVT happens often or it lasts for long periods, it can lead to heart weakness and failure. Most people with PSVT do not have any other heart disease. CAUSES Abnormal electrical activity in the heart causes PSVT. It is not known why some people get PSVT and others do not. RISK FACTORS You may be more likely to have PSVT if:  You are 20-30 years old.  You are a woman. Other factors that may increase your chances of an attack include:  Stress.  Being tired.  Smoking.  Stimulant drugs.  Alcoholic drinks.  Caffeine.  Pregnancy. SIGNS AND SYMPTOMS A mild episode of PSVT may cause no symptoms. If you do have signs and symptoms, they may include:  A pounding heart.  Feeling of skipped heartbeats (palpitations).  Weakness.  Shortness of breath.  Tightness or pain in your chest.  Light-headedness.  Anxiety.  Dizziness.  Sweating.  Nausea.  A fainting spell. DIAGNOSIS Your health care  provider may suspect PSVT if you have symptoms that come and go. The health care provider will do a physical exam. If you are having an episode during the exam, the health care provider may be able to diagnose PSVT by listening to your heart and feeling your pulse. Tests may also be done, including:  An electrical study of your heart (electrocardiogram, or ECG).  A test in which you wear a portable ECG monitor all day (Holter monitor) or for several days (event monitor).  A test that involves taking an image of your heart using sound waves (echocardiogram) to rule out other causes of a fast heart rate. TREATMENT You may not need treatment if episodes of PSVT do not happen often or if they do not cause symptoms. If PSVT episodes do cause symptoms, your health care provider may first suggest trying a self-treatment called vagus nerve stimulation. The vagus nerve extends down from the brain. It regulates certain body functions. Stimulating this nerve can slow down the heart. Your health care provider can teach you ways to do this. You may need to try a few ways to find what works best for you. Options include:  Holding your breath and pushing, as though you are having a bowel movement.  Massaging an area on one side of your neck below your jaw.  Bending forward with your head between your legs.  Bending forward with your head between your legs and coughing.  Massaging your eyeballs with your eyes closed. If vagus nerve stimulation does not work, other treatment options include:    Medicines to prevent an attack.  Being treated in the hospital with medicine or electric shock to stop an attack (cardioversion). This treatment can include:  Getting medicine through an IV line.  Having a small electric shock delivered to your heart. You will be given medicine to make you sleep through this procedure.  If you have frequent episodes with symptoms, you may need a procedure to get rid of the faulty  areas of your heart (radiofrequency ablation) and end the episodes of PSVT. In this procedure:  A long, thin tube (catheter) is passed through one of your veins into your heart.  Energy directed through the catheter eliminates the areas of your heart that are causing abnormal electric stimulation. HOME CARE INSTRUCTIONS  Take medicines only as directed by your health care provider.  Do not use caffeine in any form if caffeine triggers episodes of PSVT. Otherwise, consume caffeine in moderation. This means no more than a few cups of coffee or the equivalent each day.  Do not drink alcohol if alcohol triggers episodes of PSVT. Otherwise, limit alcohol intake to no more than 1 drink per day for nonpregnant women and 2 drinks per day for men. One drink equals 12 ounces of beer, 5 ounces of wine, or 1 ounces of hard liquor.  Do not use any tobacco products, including cigarettes, chewing tobacco, or electronic cigarettes. If you need help quitting, ask your health care provider.  Try to get at least 7 hours of sleep each night.  Find healthy ways to manage stress.  Perform vagus nerve stimulation as directed by your health care provider.  Maintain a healthy weight.  Get some exercise on most days. Ask your health care provider to suggest some good activities for you. SEEK MEDICAL CARE IF:  You are having episodes of PSVT more often, or they are lasting longer.  Vagus nerve stimulation is no longer helping.  You have new symptoms during an episode. SEEK IMMEDIATE MEDICAL CARE IF:  You have chest pain or trouble breathing.  You have an episode of PSVT that has lasted longer than 20 minutes.  You have passed out from an episode of PSVT. These symptoms may represent a serious problem that is an emergency. Do not wait to see if the symptoms will go away. Get medical help right away. Call your local emergency services (911 in the [place] Do not drive yourself to the hospital.   This  information is not intended to replace advice given to you by your health care provider. Make sure you discuss any questions you have with your health care provider.   Document Released: 03/29/2005 Document Revised: 04/19/2014 Document Reviewed: 09/06/2013 Elsevier Interactive Patient Education 2016 [employer]  

## 2015-01-28 NOTE — ED Notes (Signed)
Patient undressed, in gown, on monitor, continuous pulse oximetry and blood pressure cuff 

## 2020-12-22 ENCOUNTER — Ambulatory Visit: Payer: Self-pay | Admitting: Nurse Practitioner

## 2021-11-26 ENCOUNTER — Other Ambulatory Visit: Payer: Self-pay | Admitting: *Deleted

## 2021-11-26 NOTE — Patient Outreach (Signed)
  Care Coordination   11/26/2021  Name: Bradley Mayo MRN: [id number] DOB: 1960-11-01   Care Coordination Outreach Attempts:  An unsuccessful telephone outreach was attempted today to offer the patient information about available care coordination services as a benefit of their health plan. HIPAA compliant message left on voicemail, providing contact information for CSW, encouraging patient to return CSW's call at his earliest convenience.  Follow Up Plan:  Additional outreach attempts will be made to offer the patient care coordination information and services.   Encounter Outcome:  No Answer.   Care Coordination Interventions Activated:  No.    Care Coordination Interventions:  No, not indicated.    Danford Bad, BSW, MSW, LCSW  Licensed Restaurant manager, [employer]  Mailing [place] [address], [place], [place] [address] Physical Address[address], [place], [place] [address] Toll Free Main # [phone] Fax # [phone] Cell # [phone] [email or web address]

## 2021-12-03 ENCOUNTER — Encounter: Payer: Self-pay | Admitting: *Deleted

## 2021-12-03 ENCOUNTER — Ambulatory Visit: Payer: Self-pay | Admitting: *Deleted

## 2021-12-03 NOTE — Patient Instructions (Signed)
Visit Information  Thank you for taking time to visit with me today. Please don't hesitate to contact me if I can be of assistance to you.   Please call the care guide team at [phone] if you need to cancel or reschedule your appointment.   If you are experiencing a Mental Health or Behavioral Health Crisis or need someone to talk to, please call the Suicide and Crisis Lifeline: 988 call the [place] National Suicide Prevention Lifeline: 1-800-273-8255 or TTY: 1-800-799-4 TTY (1-800-799-4889) to talk to a trained counselor call 1-800-273-TALK (toll free, 24 hour hotline) go to [facility] [address], [place] ([phone]) call the [place] Crisis Line: 800-939-9988 call 911  Patient verbalizes understanding of instructions and care plan provided today and agrees to view in MyChart. Active MyChart status and patient understanding of how to access instructions and care plan via MyChart confirmed with patient.     No further follow up required.  Isabelly Kobler, BSW, MSW, LCSW  Licensed Clinical Social Worker  [facility]  Mailing Address-[address], [place], [place] [address] Physical Address-[address], [place], [place] [address] Toll Free Main # [phone] Fax # [phone] Cell # [phone] [email or web address]            

## 2021-12-03 NOTE — Patient Outreach (Signed)
  Care Coordination   Initial Visit Note   12/03/2021  Name: Bradley Mayo MRN: [id number] DOB: [birth year]  Bradley Mayo is a 61 y.o. year old male who sees [facility] for primary care. I spoke with Gaye Pollack by phone today.  What matters to the patients health and wellness today?  No Interventions Identified.  Assisted patient with establishing services with a primary care provider and initial appointment was scheduled.   Goals Addressed   None     SDOH assessments and interventions completed:  Yes.   SDOH Interventions Today    Flowsheet Row Most Recent Value  SDOH Interventions   Food Insecurity Interventions Intervention Not Indicated  Financial Strain Interventions Intervention Not Indicated  Housing Interventions Intervention Not Indicated  Physical Activity Interventions Patient Refused  Stress Interventions Intervention Not Indicated  Social Connections Interventions Intervention Not Indicated  Transportation Interventions Intervention Not Indicated        Care Coordination Interventions Activated:  Yes.   Care Coordination Interventions:  Yes, provided.   Follow up plan: No further intervention required.   Encounter Outcome:  Pt. Visit Completed.   Danford Bad, BSW, MSW, LCSW  Licensed Restaurant manager, [employer]  [address]ailing [address] [address] [place], [place] [address] Physical Address-[address] [place], [place] [address] Toll Free Main # [phone] Fax # [phone] Cell # [phone] [email or web address]
